# Patient Record
Sex: Female | Born: 1940 | Race: Black or African American | Hispanic: No | Smoking: Never smoker
Health system: Southern US, Community
[De-identification: ages and names within clinical notes are randomized; demographics above are authoritative.]

## PROBLEM LIST (undated history)

## (undated) DIAGNOSIS — M1990 Unspecified osteoarthritis, unspecified site: Secondary | ICD-10-CM

## (undated) DIAGNOSIS — T7840XA Allergy, unspecified, initial encounter: Secondary | ICD-10-CM

## (undated) DIAGNOSIS — F0390 Unspecified dementia without behavioral disturbance: Secondary | ICD-10-CM

## (undated) DIAGNOSIS — H269 Unspecified cataract: Secondary | ICD-10-CM

## (undated) DIAGNOSIS — I10 Essential (primary) hypertension: Secondary | ICD-10-CM

## (undated) DIAGNOSIS — E119 Type 2 diabetes mellitus without complications: Secondary | ICD-10-CM

## (undated) HISTORY — DX: Unspecified osteoarthritis, unspecified site: M19.90

## (undated) HISTORY — PX: THYROID SURGERY: SHX805

## (undated) HISTORY — DX: Unspecified cataract: H26.9

## (undated) HISTORY — DX: Allergy, unspecified, initial encounter: T78.40XA

## (undated) HISTORY — PX: ABDOMINAL HYSTERECTOMY: SHX81

## (undated) HISTORY — PX: EYE SURGERY: SHX253

---

## 2005-05-24 ENCOUNTER — Ambulatory Visit: Payer: Self-pay | Admitting: Internal Medicine

## 2005-10-19 ENCOUNTER — Emergency Department: Payer: Self-pay | Admitting: Emergency Medicine

## 2006-07-26 ENCOUNTER — Ambulatory Visit: Payer: Self-pay | Admitting: Internal Medicine

## 2007-01-21 ENCOUNTER — Emergency Department (HOSPITAL_COMMUNITY): Admission: EM | Admit: 2007-01-21 | Discharge: 2007-01-21 | Payer: Self-pay | Admitting: Emergency Medicine

## 2007-06-11 ENCOUNTER — Ambulatory Visit: Payer: Self-pay | Admitting: Urology

## 2007-08-01 ENCOUNTER — Ambulatory Visit: Payer: Self-pay | Admitting: Internal Medicine

## 2007-09-07 ENCOUNTER — Ambulatory Visit: Payer: Self-pay | Admitting: Physician Assistant

## 2007-10-26 ENCOUNTER — Ambulatory Visit: Payer: Self-pay | Admitting: Unknown Physician Specialty

## 2008-09-04 ENCOUNTER — Ambulatory Visit: Payer: Self-pay | Admitting: Internal Medicine

## 2009-04-15 ENCOUNTER — Ambulatory Visit: Payer: Self-pay | Admitting: Ophthalmology

## 2009-04-28 ENCOUNTER — Ambulatory Visit: Payer: Self-pay | Admitting: Ophthalmology

## 2009-09-09 ENCOUNTER — Ambulatory Visit: Payer: Self-pay | Admitting: Unknown Physician Specialty

## 2009-09-11 ENCOUNTER — Ambulatory Visit: Payer: Self-pay | Admitting: Unknown Physician Specialty

## 2010-03-16 ENCOUNTER — Ambulatory Visit: Payer: Self-pay | Admitting: Unknown Physician Specialty

## 2010-07-14 ENCOUNTER — Ambulatory Visit: Payer: Self-pay | Admitting: Internal Medicine

## 2010-09-12 ENCOUNTER — Emergency Department: Payer: Self-pay | Admitting: Emergency Medicine

## 2010-09-15 ENCOUNTER — Ambulatory Visit: Payer: Self-pay | Admitting: Unknown Physician Specialty

## 2011-09-20 ENCOUNTER — Ambulatory Visit: Payer: Self-pay | Admitting: Internal Medicine

## 2012-09-20 ENCOUNTER — Ambulatory Visit: Payer: Self-pay | Admitting: Internal Medicine

## 2013-06-16 ENCOUNTER — Encounter (HOSPITAL_COMMUNITY): Payer: Self-pay | Admitting: Emergency Medicine

## 2013-06-16 ENCOUNTER — Emergency Department (INDEPENDENT_AMBULATORY_CARE_PROVIDER_SITE_OTHER)
Admission: EM | Admit: 2013-06-16 | Discharge: 2013-06-16 | Disposition: A | Discharge status: Home / Self Care | Payer: Medicare Other | Source: Home / Self Care | Attending: Family Medicine | Admitting: Family Medicine

## 2013-06-16 DIAGNOSIS — A088 Other specified intestinal infections: Secondary | ICD-10-CM

## 2013-06-16 DIAGNOSIS — A084 Viral intestinal infection, unspecified: Secondary | ICD-10-CM

## 2013-06-16 MED ORDER — ONDANSETRON 4 MG PO TBDP
8.0000 mg | ORAL_TABLET | Freq: Once | ORAL | Status: AC
  Administered 2013-06-16: 8 mg via ORAL

## 2013-06-16 MED ORDER — ONDANSETRON HCL 8 MG PO TABS: 8.0000 mg | ORAL_TABLET | Freq: Two times a day (BID) | ORAL | Status: DC | PRN

## 2013-06-16 MED ORDER — ONDANSETRON 4 MG PO TBDP
ORAL_TABLET | ORAL | Status: AC
  Filled 2013-06-16: qty 2

## 2013-06-16 NOTE — ED Notes (Signed)
C/o abd pain States lower abd pain Has been vomiting, diarrhea which started on Friday Denies urinary sx

## 2013-06-16 NOTE — ED Provider Notes (Signed)
Lauren Frederick is a 72 y.o. female who presents to Urgent Care today for resolving nausea vomiting diarrhea abdominal pain. Patient developed vomiting and diarrhea on the evening of the 25th. She attributes this to a honey ham. No other family members were sick. Her symptoms persisted until this morning. She has had one episode of vomiting and 2 episodes of diarrhea today. She is improving. Her diarrhea and vomiting is getting better as is her abdominal pain. Her abdominal pain is diffuse lower and mild. She denies any fevers or chills. She denies any blood in her stool or vomit. She denies any melanotic stool. She feels well otherwise. She took one tablet of Imodium which helped.  Of note patient has not taken her blood pressure medications today because of vomiting   History reviewed. No pertinent past medical history. History  Substance Use Topics  . Smoking status: Not on file  . Smokeless tobacco: Not on file  . Alcohol Use: Not on file   ROS as above Medications reviewed. No current facility-administered medications for this encounter.   Current Outpatient Prescriptions  Medication Sig Dispense Refill  . amitriptyline (ELAVIL) 25 MG tablet Take 25 mg by mouth at bedtime.      Marland Kitchen amLODipine (NORVASC) 10 MG tablet Take 10 mg by mouth daily.      . fexofenadine (ALLEGRA) 180 MG tablet Take 180 mg by mouth daily.      Marland Kitchen glipiZIDE (GLUCOTROL) 5 MG tablet Take by mouth daily before breakfast.      . lisinopril (PRINIVIL,ZESTRIL) 40 MG tablet Take 40 mg by mouth daily.      . pravastatin (PRAVACHOL) 20 MG tablet Take 20 mg by mouth daily.      . ondansetron (ZOFRAN) 8 MG tablet Take 1 tablet (8 mg total) by mouth 2 (two) times daily as needed for nausea or vomiting.  12 tablet  0    Exam:  BP 165/88  Pulse 108  Temp(Src) 98.4 F (36.9 C) (Oral)  Resp 19  SpO2 97% Gen: Well NAD HEENT: EOMI,  MMM Lungs: Normal work of breathing. CTABL Heart: RRR no MRG Abd: NABS, Soft. NT, ND no  masses palpated. No guarding or rebound. Exts: Non edematous BL  LE, warm and well perfused.    Assessment and Plan: 72 y.o. female with viral gastroenteritis. The most likely explanation. Plan to treat with Zofran. Advised cautious use of Imodium. Followup with primary care provider. Encourage oral hydration. Discussed warning signs or symptoms. Please see discharge instructions. Patient expresses understanding.      Rodolph Bong, MD 06/16/13 [phone]

## 2013-06-16 NOTE — ED Notes (Signed)
Bed: UC06 Expected date: 06/16/13 Expected time: 5:30 PM Means of arrival:  Comments: //delouse//

## 2013-10-31 ENCOUNTER — Ambulatory Visit: Payer: Self-pay | Admitting: Unknown Physician Specialty

## 2014-01-22 ENCOUNTER — Ambulatory Visit: Payer: Self-pay | Admitting: Family Medicine

## 2014-02-17 ENCOUNTER — Encounter (HOSPITAL_COMMUNITY): Payer: Self-pay | Admitting: Emergency Medicine

## 2014-02-17 ENCOUNTER — Emergency Department (INDEPENDENT_AMBULATORY_CARE_PROVIDER_SITE_OTHER)
Admission: EM | Admit: 2014-02-17 | Discharge: 2014-02-17 | Disposition: A | Discharge status: Other Acute Inpatient Hospital | Payer: Medicare Other | Source: Home / Self Care | Attending: Emergency Medicine | Admitting: Emergency Medicine

## 2014-02-17 ENCOUNTER — Emergency Department (HOSPITAL_COMMUNITY): Payer: Medicare Other

## 2014-02-17 ENCOUNTER — Emergency Department (HOSPITAL_COMMUNITY)
Admission: EM | Admit: 2014-02-17 | Discharge: 2014-02-17 | Disposition: A | Discharge status: Home / Self Care | Payer: Medicare Other | Attending: Emergency Medicine | Admitting: Emergency Medicine

## 2014-02-17 DIAGNOSIS — R109 Unspecified abdominal pain: Secondary | ICD-10-CM | POA: Insufficient documentation

## 2014-02-17 DIAGNOSIS — K5289 Other specified noninfective gastroenteritis and colitis: Secondary | ICD-10-CM | POA: Insufficient documentation

## 2014-02-17 DIAGNOSIS — I10 Essential (primary) hypertension: Secondary | ICD-10-CM | POA: Insufficient documentation

## 2014-02-17 DIAGNOSIS — K529 Noninfective gastroenteritis and colitis, unspecified: Secondary | ICD-10-CM

## 2014-02-17 DIAGNOSIS — Z79899 Other long term (current) drug therapy: Secondary | ICD-10-CM | POA: Diagnosis not present

## 2014-02-17 DIAGNOSIS — R1084 Generalized abdominal pain: Secondary | ICD-10-CM

## 2014-02-17 DIAGNOSIS — E119 Type 2 diabetes mellitus without complications: Secondary | ICD-10-CM | POA: Insufficient documentation

## 2014-02-17 DIAGNOSIS — K920 Hematemesis: Secondary | ICD-10-CM

## 2014-02-17 HISTORY — DX: Type 2 diabetes mellitus without complications: E11.9

## 2014-02-17 HISTORY — DX: Essential (primary) hypertension: I10

## 2014-02-17 LAB — CBC WITH DIFFERENTIAL/PLATELET
Basophils Absolute: 0 10*3/uL (ref 0.0–0.1)
Basophils Relative: 0 % (ref 0–1)
Eosinophils Absolute: 0.1 10*3/uL (ref 0.0–0.7)
Eosinophils Relative: 3 % (ref 0–5)
HEMATOCRIT: 37 % (ref 36.0–46.0)
HEMOGLOBIN: 12.4 g/dL (ref 12.0–15.0)
LYMPHS ABS: 1 10*3/uL (ref 0.7–4.0)
LYMPHS PCT: 20 % (ref 12–46)
MCH: 30 pg (ref 26.0–34.0)
MCHC: 33.5 g/dL (ref 30.0–36.0)
MCV: 89.4 fL (ref 78.0–100.0)
MONO ABS: 0.7 10*3/uL (ref 0.1–1.0)
MONOS PCT: 14 % — AB (ref 3–12)
NEUTROS ABS: 3.2 10*3/uL (ref 1.7–7.7)
NEUTROS PCT: 63 % (ref 43–77)
Platelets: 200 10*3/uL (ref 150–400)
RBC: 4.14 MIL/uL (ref 3.87–5.11)
RDW: 13.1 % (ref 11.5–15.5)
WBC: 5 10*3/uL (ref 4.0–10.5)

## 2014-02-17 LAB — POCT URINALYSIS DIP (DEVICE)
BILIRUBIN URINE: NEGATIVE
GLUCOSE, UA: NEGATIVE mg/dL
Ketones, ur: NEGATIVE mg/dL
LEUKOCYTES UA: NEGATIVE
NITRITE: NEGATIVE
Protein, ur: NEGATIVE mg/dL
Specific Gravity, Urine: 1.01 (ref 1.005–1.030)
UROBILINOGEN UA: 0.2 mg/dL (ref 0.0–1.0)
pH: 7 (ref 5.0–8.0)

## 2014-02-17 LAB — COMPREHENSIVE METABOLIC PANEL
ALBUMIN: 3.4 g/dL — AB (ref 3.5–5.2)
ALK PHOS: 65 U/L (ref 39–117)
ALT: 13 U/L (ref 0–35)
ANION GAP: 13 (ref 5–15)
AST: 14 U/L (ref 0–37)
BILIRUBIN TOTAL: 0.5 mg/dL (ref 0.3–1.2)
BUN: 3 mg/dL — AB (ref 6–23)
CHLORIDE: 100 meq/L (ref 96–112)
CO2: 24 mEq/L (ref 19–32)
CREATININE: 0.69 mg/dL (ref 0.50–1.10)
Calcium: 9.1 mg/dL (ref 8.4–10.5)
GFR calc Af Amer: >90 mL/min (ref >90)
GFR calc non Af Amer: 84 mL/min — ABNORMAL LOW (ref >90)
Glucose, Bld: 117 mg/dL — ABNORMAL HIGH (ref 70–99)
POTASSIUM: 3.3 meq/L — AB (ref 3.7–5.3)
Sodium: 137 mEq/L (ref 137–147)
TOTAL PROTEIN: 6.7 g/dL (ref 6.0–8.3)

## 2014-02-17 LAB — POC OCCULT BLOOD, ED: Fecal Occult Bld: NEGATIVE

## 2014-02-17 LAB — LIPASE, BLOOD: LIPASE: 7 U/L — AB (ref 11–59)

## 2014-02-17 MED ORDER — SODIUM CHLORIDE 0.9 % IV BOLUS (SEPSIS)
500.0000 mL | Freq: Once | INTRAVENOUS | Status: AC
  Administered 2014-02-17: 500 mL via INTRAVENOUS

## 2014-02-17 MED ORDER — ONDANSETRON 4 MG PO TBDP
8.0000 mg | ORAL_TABLET | Freq: Once | ORAL | Status: AC
  Administered 2014-02-17: 8 mg via ORAL

## 2014-02-17 MED ORDER — IOHEXOL 300 MG/ML  SOLN
25.0000 mL | INTRAMUSCULAR | Status: AC
  Administered 2014-02-17: 25 mL via ORAL

## 2014-02-17 MED ORDER — ONDANSETRON 4 MG PO TBDP
ORAL_TABLET | ORAL | Status: AC
  Filled 2014-02-17: qty 2

## 2014-02-17 MED ORDER — ONDANSETRON 4 MG PO TBDP: 4.0000 mg | ORAL_TABLET | Freq: Three times a day (TID) | ORAL | Status: DC | PRN

## 2014-02-17 MED ORDER — TRAMADOL HCL 50 MG PO TABS: 50.0000 mg | ORAL_TABLET | Freq: Four times a day (QID) | ORAL | Status: DC | PRN

## 2014-02-17 MED ORDER — ONDANSETRON HCL 4 MG/2ML IJ SOLN
4.0000 mg | Freq: Once | INTRAMUSCULAR | Status: AC
  Administered 2014-02-17: 4 mg via INTRAVENOUS
  Filled 2014-02-17: qty 2

## 2014-02-17 MED ORDER — IOHEXOL 300 MG/ML  SOLN: 100.0000 mL | Freq: Once | INTRAMUSCULAR | Status: DC | PRN

## 2014-02-17 MED ORDER — SODIUM CHLORIDE 0.9 % IV SOLN
INTRAVENOUS | Status: DC
  Administered 2014-02-17: 1000 mL via INTRAVENOUS

## 2014-02-17 NOTE — ED Notes (Signed)
Transfer from [facility] for abd pain, N/V, and dark stools, and blood in emesis. This has been going on for 4 days.

## 2014-02-17 NOTE — ED Notes (Signed)
Pt remains monitored by blood pressure, pulse ox, and 5 lead.  

## 2014-02-17 NOTE — ED Notes (Signed)
MD at bedside. 

## 2014-02-17 NOTE — ED Notes (Signed)
Pts vitals updated and IV removed. Pt is getting dressed and awaiting discharge paperwork at bedside.

## 2014-02-17 NOTE — ED Notes (Signed)
Returned from ct 

## 2014-02-17 NOTE — Discharge Instructions (Signed)
Take the Zofran as needed for the nausea and vomiting. Take the tramadol as needed for the abdominal cramping. Return for any new or worse symptoms. Return for vomiting increased amounts of blood or blood in the bowel movements. Or worse pain. Bowel movements been black in color. Make an appointment to followup with your regular Dr.

## 2014-02-17 NOTE — ED Provider Notes (Signed)
Chief Complaint   Chief Complaint  Patient presents with  . Abdominal Pain    History of Present Illness   Lauren Frederick is a 73 year old female who has had a four-day history of generalized abdominal pain, nausea, vomiting, and diarrhea. Previously her vomitus has been clear or contained food that she had eaten. Today she vomited up a large amount of bright red blood. She's had numerous diarrheal stools every day, her stools are watery and dark. She denies any fever or chills. She has felt somewhat weak, dizzy, lightheaded. She denies any respiratory symptoms or chest pain. She denies any sick exposures, suspicious ingestions, or foreign travel.  Review of Systems   Other than as noted above, the patient denies any of the following symptoms: Constitutional:  No fever, chills, weight loss or anorexia. Abdomen:  No nausea, vomiting, hematememesis, melena, diarrhea, or hematochezia. GU:  No dysuria, frequency, urgency, or hematuria. Gyn:  No vaginal discharge, itching, abnormal bleeding, dyspareunia, or pelvic pain.  PMFSH   Past medical history, family history, social history, meds, and allergies were reviewed. Current meds include amitriptyline, amlodipine, glipizide, lisinopril, and pravastatin. She has a sensitivity to aspirin. She has hypertension, diabetes, and a history of ulcers.  Physical Exam     Vital signs:  BP 144/71  Pulse 110  Temp(Src) 98.4 F (36.9 C) (Oral)  Resp 16  SpO2 98% Gen:  Alert, oriented, in no distress. Lungs:  Breath sounds clear and equal bilaterally.  No wheezes, rales or rhonchi. Heart:  Regular rhythm.  No gallops or murmers.   Abdomen:  Abdomen is soft but she has generalized tenderness to palpation without guarding or rebound. No organomegaly or mass. Bowel sounds are hyperactive. Skin:  Clear, warm and dry.  No rash.  Labs   Results for orders placed during the hospital encounter of 02/17/14  POCT URINALYSIS DIP (DEVICE)      Result Value  Ref Range   Glucose, UA NEGATIVE  NEGATIVE mg/dL   Bilirubin Urine NEGATIVE  NEGATIVE   Ketones, ur NEGATIVE  NEGATIVE mg/dL   Specific Gravity, Urine 1.010  1.005 - 1.030   Hgb urine dipstick TRACE (*) NEGATIVE   pH 7.0  5.0 - 8.0   Protein, ur NEGATIVE  NEGATIVE mg/dL   Urobilinogen, UA 0.2  0.0 - 1.0 mg/dL   Nitrite NEGATIVE  NEGATIVE   Leukocytes, UA NEGATIVE  NEGATIVE   Course in Urgent Care Center   The following medications were given:  Medications  ondansetron (ZOFRAN-ODT) disintegrating tablet 8 mg (not administered)   Assessment   The primary encounter diagnosis was Generalized abdominal pain. A diagnosis of Hematemesis with nausea was also pertinent to this visit.  Differential diagnosis includes viral gastroenteritis, ulcer disease, gastritis, gallbladder disease, diverticulitis, or ischemic bowel disease. She'll need complete workup. Also with history of GI bleeding she'll need to be monitored for drop in hemoglobin. Finally she appears to be mildly dehydrated with some tachycardia. She'll probably need some IV fluids.  Plan     The patient was transferred to the ED via shuttle in stable condition.  Medical Decision Making:  73 year old female has a 4 day history of generalized abdominal abdominal pain, nausea, vomiting and hemeatemesis.  She has also had diarrhea with dark stools.  On exam her abdomen is soft and has generalized tenderness with hyperactive bowel sounds.  Differential is extensive, but with hematenesis and tachycardia she will need GI workup.       Dineen Kid  Lorenz Coaster, MD 02/17/14 [phone]

## 2014-02-17 NOTE — ED Notes (Signed)
Patient transported to CT 

## 2014-02-17 NOTE — ED Notes (Signed)
Pt      Reports    abd       Pain     With     Nausea   Vomiting        And  Diarrhea      X  3  Days   Reports      Noticed    Some  Blood       In      Vomitus           She  Is  Awake  And  Alert  And  Oriented

## 2014-02-17 NOTE — ED Notes (Signed)
Pt remains monitored by blood pressure, pulse ox, and 5 lead. pts family remains at bedside.  

## 2014-02-17 NOTE — ED Provider Notes (Signed)
CSN: [id number]     Arrival date & time 02/17/14  4098 History   First MD Initiated Contact with Patient 02/17/14 [phone]     Chief Complaint  Patient presents with  . Abdominal Pain     (Consider location/radiation/quality/duration/timing/severity/associated sxs/prior Treatment) Patient is a 73 y.o. female presenting with abdominal pain. The history is provided by the patient.  Abdominal Pain Associated symptoms: diarrhea, nausea and vomiting   Associated symptoms: no chest pain, no constipation, no dysuria, no fever and no shortness of breath    patient sent in from urgent care for nausea vomiting and diarrhea for the past 4 days on and off lower quadrant abdominal pain. Had some vomiting of blood or streaks of blood. Patient's heart rate was somewhat elevated there a 1/10. Rate here is below 100. Patient has similar problem back in November for a thought was a gastroenteritis. Patient stated that her stools have been brown has not seen blood in the stools have not been black in color. Patient has not felt like she's got pass out. Patient's been vomiting some over the past 4 days. Had today was the first time has been streaks of blood. No pools of blood.  Past Medical History  Diagnosis Date  . Hypertension   . Diabetes mellitus without complication    Past Surgical History  Procedure Laterality Date  . Abdominal hysterectomy    . Thyroid surgery     No family history on file. History  Substance Use Topics  . Smoking status: Never Smoker   . Smokeless tobacco: Not on file  . Alcohol Use: No   OB History   Grav Para Term Preterm Abortions TAB SAB Ect Mult Living                 Review of Systems  Constitutional: Negative for fever.  HENT: Negative for congestion.   Eyes: Negative for visual disturbance.  Respiratory: Negative for shortness of breath.   Cardiovascular: Negative for chest pain.  Gastrointestinal: Positive for nausea, vomiting, abdominal pain and diarrhea.  Negative for constipation and blood in stool.  Genitourinary: Negative for dysuria.  Musculoskeletal: Negative for back pain.  Skin: Negative for rash.  Neurological: Negative for headaches.  Hematological: Does not bruise/bleed easily.  Psychiatric/Behavioral: Negative for confusion.      Allergies  Aspirin  Home Medications   Prior to Admission medications   Medication Sig Start Date End Date Taking? Authorizing Provider  amitriptyline (ELAVIL) 25 MG tablet Take 25 mg by mouth at bedtime.   Yes Historical Provider, MD  amLODipine (NORVASC) 10 MG tablet Take 10 mg by mouth daily.   Yes Historical Provider, MD  fexofenadine (ALLEGRA) 180 MG tablet Take 180 mg by mouth daily.   Yes Historical Provider, MD  glipiZIDE (GLUCOTROL) 5 MG tablet Take by mouth daily before breakfast.   Yes Historical Provider, MD  lisinopril (PRINIVIL,ZESTRIL) 40 MG tablet Take 40 mg by mouth daily.   Yes Historical Provider, MD  pravastatin (PRAVACHOL) 20 MG tablet Take 20 mg by mouth daily.   Yes Historical Provider, MD  ondansetron (ZOFRAN ODT) 4 MG disintegrating tablet Take 1 tablet (4 mg total) by mouth every 8 (eight) hours as needed for nausea or vomiting. 02/17/14   Vanetta Mulders, MD  traMADol (ULTRAM) 50 MG tablet Take 1 tablet (50 mg total) by mouth every 6 (six) hours as needed. 02/17/14   Vanetta Mulders, MD   BP 112/60  Pulse 94  Temp(Src) 98.3 F (36.8 C) (  Oral)  Resp 20  Ht  (1.651 m)  Wt 168 lb (76.204 kg)  BMI 27.96 kg/m2  SpO2 97% Physical Exam  Nursing note and vitals reviewed. Constitutional: She is oriented to person, place, and time. She appears well-developed and well-nourished. No distress.  HENT:  Head: Normocephalic and atraumatic.  Mouth/Throat: Oropharynx is clear and moist.  Eyes: Conjunctivae and EOM are normal. Pupils are equal, round, and reactive to light.  Neck: Normal range of motion.  Cardiovascular: Normal rate, regular rhythm and normal heart sounds.    No murmur heard. Pulmonary/Chest: Effort normal and breath sounds normal. No respiratory distress.  Abdominal: Soft. Bowel sounds are normal. There is no tenderness.  Genitourinary:  Perianal area is normal. No evidence of hemorrhoids. Rectal exam no significant stool in the vault not grossly bloody black in color. Lab run Hemoccult.  Musculoskeletal: Normal range of motion.  Neurological: She is alert and oriented to person, place, and time. No cranial nerve deficit. She exhibits normal muscle tone. Coordination normal.  Skin: No rash noted.    ED Course  Procedures (including critical care time) Labs Review Labs Reviewed  COMPREHENSIVE METABOLIC PANEL - Abnormal; Notable for the following:    Potassium 3.3 (*)    Glucose, Bld 117 (*)    BUN 3 (*)    Albumin 3.4 (*)    GFR calc non Af Amer 84 (*)    All other components within normal limits  LIPASE, BLOOD - Abnormal; Notable for the following:    Lipase 7 (*)    All other components within normal limits  CBC WITH DIFFERENTIAL - Abnormal; Notable for the following:    Monocytes Relative 14 (*)    All other components within normal limits  CLOSTRIDIUM DIFFICILE BY PCR  POC OCCULT BLOOD, ED   Results for orders placed during the hospital encounter of 02/17/14  COMPREHENSIVE METABOLIC PANEL      Result Value Ref Range   Sodium 137  137 - 147 mEq/L   Potassium 3.3 (*) 3.7 - 5.3 mEq/L   Chloride 100  96 - 112 mEq/L   CO2 24  19 - 32 mEq/L   Glucose, Bld 117 (*) 70 - 99 mg/dL   BUN 3 (*) 6 - 23 mg/dL   Creatinine, Ser 1.61  0.50 - 1.10 mg/dL   Calcium 9.1  8.4 - 09.6 mg/dL   Total Protein 6.7  6.0 - 8.3 g/dL   Albumin 3.4 (*) 3.5 - 5.2 g/dL   AST 14  0 - 37 U/L   ALT 13  0 - 35 U/L   Alkaline Phosphatase 65  39 - 117 U/L   Total Bilirubin 0.5  0.3 - 1.2 mg/dL   GFR calc non Af Amer 84 (*) >90 mL/min   GFR calc Af Amer >90  >90 mL/min   Anion gap 13  5 - 15  LIPASE, BLOOD      Result Value Ref Range   Lipase 7 (*) 11 -  59 U/L  CBC WITH DIFFERENTIAL      Result Value Ref Range   WBC 5.0  4.0 - 10.5 K/uL   RBC 4.14  3.87 - 5.11 MIL/uL   Hemoglobin 12.4  12.0 - 15.0 g/dL   HCT 04.5  40.9 - 81.1 %   MCV 89.4  78.0 - 100.0 fL   MCH 30.0  26.0 - 34.0 pg   MCHC 33.5  30.0 - 36.0 g/dL   RDW 91.4  78.2 -  15.5 %   Platelets 200  150 - 400 K/uL   Neutrophils Relative % 63  43 - 77 %   Neutro Abs 3.2  1.7 - 7.7 K/uL   Lymphocytes Relative 20  12 - 46 %   Lymphs Abs 1.0  0.7 - 4.0 K/uL   Monocytes Relative 14 (*) 3 - 12 %   Monocytes Absolute 0.7  0.1 - 1.0 K/uL   Eosinophils Relative 3  0 - 5 %   Eosinophils Absolute 0.1  0.0 - 0.7 K/uL   Basophils Relative 0  0 - 1 %   Basophils Absolute 0.0  0.0 - 0.1 K/uL     Imaging Review Ct Abdomen Pelvis W Contrast  02/17/2014   CLINICAL DATA:  Abdominal pain.  EXAM: CT ABDOMEN AND PELVIS WITH CONTRAST  TECHNIQUE: Multidetector CT imaging of the abdomen and pelvis was performed using the standard protocol following bolus administration of intravenous contrast.  CONTRAST:  100 cc Omnipaque 300  COMPARISON:  None.  FINDINGS: The liver, gallbladder, spleen, pancreas, adrenal glands are within normal limits.  Small cysts in the kidneys.  Ascending and transverse colon are relatively distended without clear or abrupt transition point likely due to colonic ileus. No disproportionate dilatation of small bowel  Small mesenteric nodes.  Bladder is unremarkable. Uterus is absent. Adnexa are within normal limits.  Lumbar degenerative disc disease. Broad-based disc herniation at L3-4 with some degree of spinal stenosis and lateral recess narrowing left greater than right.  No destructive bone lesion.  No free-fluid.  IMPRESSION: Colonic distention without clear transition point likely due to colonic ileus.   Electronically Signed   By: Maryclare Bean M.D.   On: 02/17/2014 12:19     EKG Interpretation None      MDM   Final diagnoses:  Gastroenteritis    Patient sent over from  urgent care this morning for abdominal pain nausea vomiting supposedly dark stools and having some blood in the emesis. Patient describes streaking no pool of blood. Symptoms been ongoing for about 4 days. Patient with nausea vomiting and also having some diarrhea. Crampy abdominal pain in the lower quadrants. Today's workup shows normal hemoglobin hematocrit. Patient without any vomiting or diarrhea here. No bowel movements here. CT scan raises concern for low bit of colonic distention without evidence of of obstruction. Patient improved here significantly with Zofran.  Patient's rectal exam is negative for dark stool or a gross blood. Patient in the neck was stable was tachycardic at urgent care at tachycardic here. Patient discharged home with close followup with her regular Dr. Precautions provided. Patient has had a colonoscopy this year without any significant findings. Suspect symptoms could be related to a gastroenteritis perhaps viral. The patient sits this occur couple times he year. Also suspect that the blood that occurred was more just from the frequent vomiting. Patient and vomiting for several days if worse only streaks of blood just today.    Vanetta Mulders, MD 02/17/14 [phone]

## 2014-02-17 NOTE — Discharge Instructions (Signed)
We have determined that your problem requires further evaluation in the emergency department.  We will take care of your transport there.  Once at the emergency department, you will be evaluated by a provider and they will order whatever treatment or tests they deem necessary.  We cannot guarantee that they will do any specific test or do any specific treatment.  ° °

## 2015-04-24 ENCOUNTER — Other Ambulatory Visit: Payer: Self-pay | Admitting: Family Medicine

## 2015-04-24 DIAGNOSIS — Z1231 Encounter for screening mammogram for malignant neoplasm of breast: Secondary | ICD-10-CM

## 2015-04-28 ENCOUNTER — Ambulatory Visit (INDEPENDENT_AMBULATORY_CARE_PROVIDER_SITE_OTHER): Payer: Medicare Other | Admitting: Internal Medicine

## 2015-04-28 VITALS — BP 128/76 | HR 87 | Temp 99.3°F | Resp 16 | Ht 64.0 in | Wt 160.4 lb

## 2015-04-28 DIAGNOSIS — R509 Fever, unspecified: Secondary | ICD-10-CM

## 2015-04-28 DIAGNOSIS — E1122 Type 2 diabetes mellitus with diabetic chronic kidney disease: Secondary | ICD-10-CM

## 2015-04-28 DIAGNOSIS — K529 Noninfective gastroenteritis and colitis, unspecified: Secondary | ICD-10-CM

## 2015-04-28 DIAGNOSIS — E860 Dehydration: Secondary | ICD-10-CM

## 2015-04-28 LAB — POCT URINALYSIS DIP (MANUAL ENTRY)
BILIRUBIN UA: NEGATIVE
GLUCOSE UA: NEGATIVE
Ketones, POC UA: NEGATIVE
LEUKOCYTES UA: NEGATIVE
NITRITE UA: NEGATIVE
Protein Ur, POC: NEGATIVE
Spec Grav, UA: 1.01
UROBILINOGEN UA: 0.2
pH, UA: 7

## 2015-04-28 LAB — POCT CBC
Granulocyte percent: 66.6 %G (ref 37–80)
HEMATOCRIT: 39.6 % (ref 37.7–47.9)
Hemoglobin: 13.5 g/dL (ref 12.2–16.2)
LYMPH, POC: 1.4 (ref 0.6–3.4)
MCH, POC: 29.3 pg (ref 27–31.2)
MCHC: 34.1 g/dL (ref 31.8–35.4)
MCV: 86 fL (ref 80–97)
MID (CBC): 0.1 (ref 0–0.9)
MPV: 7.1 fL (ref 0–99.8)
POC GRANULOCYTE: 3 (ref 2–6.9)
POC LYMPH %: 30.2 % (ref 10–50)
POC MID %: 3.2 % (ref 0–12)
Platelet Count, POC: 232 10*3/uL (ref 142–424)
RBC: 4.6 M/uL (ref 4.04–5.48)
RDW, POC: 14.2 %
WBC: 4.5 10*3/uL — AB (ref 4.6–10.2)

## 2015-04-28 LAB — POC MICROSCOPIC URINALYSIS (UMFC): Mucus: ABSENT

## 2015-04-28 LAB — COMPREHENSIVE METABOLIC PANEL
ALT: 15 U/L (ref 6–29)
AST: 17 U/L (ref 10–35)
Albumin: 3.8 g/dL (ref 3.6–5.1)
Alkaline Phosphatase: 60 U/L (ref 33–130)
BUN: 5 mg/dL — AB (ref 7–25)
CHLORIDE: 102 mmol/L (ref 98–110)
CO2: 25 mmol/L (ref 20–31)
CREATININE: 0.56 mg/dL — AB (ref 0.60–0.93)
Calcium: 9.2 mg/dL (ref 8.6–10.4)
Glucose, Bld: 150 mg/dL — ABNORMAL HIGH (ref 65–99)
POTASSIUM: 3.4 mmol/L — AB (ref 3.5–5.3)
SODIUM: 139 mmol/L (ref 135–146)
TOTAL PROTEIN: 6.6 g/dL (ref 6.1–8.1)
Total Bilirubin: 0.4 mg/dL (ref 0.2–1.2)

## 2015-04-28 LAB — GLUCOSE, POCT (MANUAL RESULT ENTRY): POC Glucose: 153 mg/dl — AB (ref 70–99)

## 2015-04-28 LAB — LIPASE: LIPASE: 5 U/L — AB (ref 7–60)

## 2015-04-28 MED ORDER — ONDANSETRON HCL 8 MG PO TABS: 8.0000 mg | ORAL_TABLET | Freq: Three times a day (TID) | ORAL | Status: DC | PRN

## 2015-04-28 MED ORDER — ONDANSETRON 4 MG PO TBDP
8.0000 mg | ORAL_TABLET | Freq: Once | ORAL | Status: AC
  Administered 2015-04-28: 8 mg via ORAL

## 2015-04-28 NOTE — Progress Notes (Signed)
Patient ID: Lauren Frederick, female   DOB: 07-Nov-1940, 74 y.o.   MRN: [id number]   04/28/2015 at 12:44 PM  Lauren Frederick / DOB: 10-13-1940 / MRN: [id number]  Problem list reviewed and updated by me where necessary.   SUBJECTIVE  Lauren Frederick is a 74 y.o. ill appearing female presenting for the chief complaint of nausea, vomiting, diarrhea for 3-4 days, now is feeling weaker and wobbly on her feet. Her primary care is not in the [facility] system and is in [place].  She is on meds for diates and htn but has not taken them for 4-5 days.   She  has a past medical history of Hypertension; Diabetes mellitus without complication (HCC); Allergy; Arthritis; and Cataract.    Medications reviewed and updated by myself where necessary, and exist elsewhere in the encounter.   Lauren Frederick is allergic to aspirin. She  reports that she has never smoked. She does not have any smokeless tobacco history on file. She reports that she does not drink alcohol or use illicit drugs. She  has no sexual activity history on file. The patient  has past surgical history that includes Abdominal hysterectomy; Thyroid surgery; Thyroid surgery (approx. 30 years ago); and Eye surgery.  Her family history includes Cancer in her mother; Hypertension in her father.  Review of Systems  Constitutional: Positive for fever, malaise/fatigue and diaphoresis.  HENT: Negative.   Respiratory: Negative for shortness of breath.   Cardiovascular: Negative for chest pain.  Gastrointestinal: Positive for nausea, vomiting, abdominal pain and diarrhea. Negative for blood in stool and melena.  Genitourinary: Negative.   Skin: Negative for rash.  Neurological: Positive for dizziness and weakness. Negative for headaches.    OBJECTIVE  Her  height is  (1.626 m) and weight is 160 lb 6.4 oz (72.757 kg). Her oral temperature is 99.3 F (37.4 C). Her blood pressure is 128/76 and her pulse is 87. Her respiration is 16 and oxygen saturation is  98%.  The patient's body mass index is 27.52 kg/(m^2).  Physical Exam  Constitutional: She is oriented to person, place, and time. She appears well-developed and well-nourished. She appears distressed.  HENT:  Head: Normocephalic.  Eyes: Conjunctivae and EOM are normal. Pupils are equal, round, and reactive to light. No scleral icterus.  Neck: Normal range of motion. Neck supple.  Cardiovascular: Normal rate, regular rhythm and normal heart sounds.   Respiratory: Effort normal and breath sounds normal.  GI: Soft. She exhibits no distension and no mass. There is tenderness. There is no rebound and no guarding.  Musculoskeletal: Normal range of motion.  Neurological: She is alert and oriented to person, place, and time. She has normal strength. No cranial nerve deficit or sensory deficit. Gait abnormal. Coordination normal.  Unsteady gate due to light headed and weak  Skin: Skin is warm. She is diaphoretic.  Psychiatric: She has a normal mood and affect.   IV fluids one liter given feels much better Zofran odt  given--feels better  Results for orders placed or performed in visit on 04/28/15 (from the past 24 hour(s))  POCT CBC     Status: Abnormal   Collection Time: 04/28/15 11:55 AM  Result Value Ref Range   WBC 4.5 (A) 4.6 - 10.2 K/uL   Lymph, poc 1.4 0.6 - 3.4   POC LYMPH PERCENT 30.2 10 - 50 %L   MID (cbc) 0.1 0 - 0.9   POC MID % 3.2 0 - 12 %M  POC Granulocyte 3.0 2 - 6.9   Granulocyte percent 66.6 37 - 80 %G   RBC 4.60 4.04 - 5.48 M/uL   Hemoglobin 13.5 12.2 - 16.2 g/dL   HCT, POC 16.1 09.6 - 47.9 %   MCV 86.0 80 - 97 fL   MCH, POC 29.3 27 - 31.2 pg   MCHC 34.1 31.8 - 35.4 g/dL   RDW, POC 04.5 %   Platelet Count, POC 232 142 - 424 K/uL   MPV 7.1 0 - 99.8 fL  POCT glucose (manual entry)     Status: Abnormal   Collection Time: 04/28/15 11:56 AM  Result Value Ref Range   POC Glucose 153 (A) 70 - 99 mg/dl  POCT urinalysis dipstick     Status: Abnormal   Collection  Time: 04/28/15 12:00 PM  Result Value Ref Range   Color, UA yellow yellow   Clarity, UA clear clear   Glucose, UA negative negative   Bilirubin, UA negative negative   Ketones, POC UA negative negative   Spec Grav, UA 1.010    Blood, UA trace-lysed (A) negative   pH, UA 7.0    Protein Ur, POC negative negative   Urobilinogen, UA 0.2    Nitrite, UA Negative Negative   Leukocytes, UA Negative Negative  POCT Microscopic Urinalysis ([facility])     Status: None   Collection Time: 04/28/15 12:00 PM  Result Value Ref Range   WBC,UR,HPF,POC None None WBC/hpf   RBC,UR,HPF,POC None None RBC/hpf   Bacteria None None, Too numerous to count   Mucus Absent Absent   Epithelial Cells, UR Per Microscopy None None, Too numerous to count cells/hpf    ASSESSMENT & PLAN  Lauren Frederick was seen today for fatigue.  Diagnoses and all orders for this visit:  Acute gastroenteritis -     POCT CBC -     IFOBT POC (occult bld, rslt in office) -     POCT glucose (manual entry) -     POCT urinalysis dipstick -     POCT Microscopic Urinalysis ([facility]) -     Comprehensive metabolic panel -     Stool culture -     Lipase -     ondansetron (ZOFRAN-ODT) disintegrating tablet 8 mg; Take 2 tablets (8 mg total) by mouth once. -     ondansetron (ZOFRAN) 8 MG tablet; Take 1 tablet (8 mg total) by mouth every 8 (eight) hours as needed for nausea or vomiting.  Fever, unspecified -     POCT CBC -     IFOBT POC (occult bld, rslt in office) -     POCT glucose (manual entry) -     POCT urinalysis dipstick -     POCT Microscopic Urinalysis ([facility]) -     Comprehensive metabolic panel -     Stool culture -     Lipase -     ondansetron (ZOFRAN) 8 MG tablet; Take 1 tablet (8 mg total) by mouth every 8 (eight) hours as needed for nausea or vomiting.  Dehydration -     POCT CBC -     IFOBT POC (occult bld, rslt in office) -     POCT glucose (manual entry) -     POCT urinalysis dipstick -     POCT Microscopic Urinalysis  ([facility]) -     Comprehensive metabolic panel -     Stool culture -     Lipase -     ondansetron (ZOFRAN) 8 MG tablet; Take 1  tablet (8 mg total) by mouth every 8 (eight) hours as needed for nausea or vomiting.  Type 2 diabetes mellitus with chronic kidney disease, without long-term current use of insulin, unspecified CKD stage (HCC) -     POCT CBC -     IFOBT POC (occult bld, rslt in office) -     POCT glucose (manual entry) -     POCT urinalysis dipstick -     POCT Microscopic Urinalysis ([facility]) -     Comprehensive metabolic panel -     Stool culture -     Lipase -     ondansetron (ZOFRAN) 8 MG tablet; Take 1 tablet (8 mg total) by mouth every 8 (eight) hours as needed for nausea or vomiting.

## 2015-04-28 NOTE — Patient Instructions (Addendum)
Viral Gastroenteritis Viral gastroenteritis is also known as stomach flu. This condition affects the stomach and intestinal tract. It can cause sudden diarrhea and vomiting. The illness typically lasts 3 to 8 days. Most people develop an immune response that eventually gets rid of the virus. While this natural response develops, the virus can make you quite ill. CAUSES  Many different viruses can cause gastroenteritis, such as rotavirus or noroviruses. You can catch one of these viruses by consuming contaminated food or water. You may also catch a virus by sharing utensils or other personal items with an infected person or by touching a contaminated surface. SYMPTOMS  The most common symptoms are diarrhea and vomiting. These problems can cause a severe loss of body fluids (dehydration) and a body salt (electrolyte) imbalance. Other symptoms may include:  Fever.  Headache.  Fatigue.  Abdominal pain. DIAGNOSIS  Your caregiver can usually diagnose viral gastroenteritis based on your symptoms and a physical exam. A stool sample may also be taken to test for the presence of viruses or other infections. TREATMENT  This illness typically goes away on its own. Treatments are aimed at rehydration. The most serious cases of viral gastroenteritis involve vomiting so severely that you are not able to keep fluids down. In these cases, fluids must be given through an intravenous line (IV). HOME CARE INSTRUCTIONS   Drink enough fluids to keep your urine clear or pale yellow. Drink small amounts of fluids frequently and increase the amounts as tolerated.  Ask your caregiver for specific rehydration instructions.  Avoid:  Foods high in sugar.  Alcohol.  Carbonated drinks.  Tobacco.  Juice.  Caffeine drinks.  Extremely hot or cold fluids.  Fatty, greasy foods.  Too much intake of anything at one time.  Dairy products until 24 to 48 hours after diarrhea stops.  You may consume probiotics.  Probiotics are active cultures of beneficial bacteria. They may lessen the amount and number of diarrheal stools in adults. Probiotics can be found in yogurt with active cultures and in supplements.  Wash your hands well to avoid spreading the virus.  Only take over-the-counter or prescription medicines for pain, discomfort, or fever as directed by your caregiver. Do not give aspirin to children. Antidiarrheal medicines are not recommended.  Ask your caregiver if you should continue to take your regular prescribed and over-the-counter medicines.  Keep all follow-up appointments as directed by your caregiver. SEEK IMMEDIATE MEDICAL CARE IF:   You are unable to keep fluids down.  You do not urinate at least once every 6 to 8 hours.  You develop shortness of breath.  You notice blood in your stool or vomit. This may look like coffee grounds.  You have abdominal pain that increases or is concentrated in one small area (localized).  You have persistent vomiting or diarrhea.  You have a fever.  The patient is a child younger than 3 months, and he or she has a fever.  The patient is a child older than 3 months, and he or she has a fever and persistent symptoms.  The patient is a child older than 3 months, and he or she has a fever and symptoms suddenly get worse.  The patient is a baby, and he or she has no tears when crying. MAKE SURE YOU:   Understand these instructions.  Will watch your condition.  Will get help right away if you are not doing well or get worse.   This information is not intended to replace   advice given to you by your health care provider. Make sure you discuss any questions you have with your health care provider.   Document Released: 06/06/2005 Document Revised: 08/29/2011 Document Reviewed: 03/23/2011 Elsevier Interactive Patient Education 2016 Elsevier Inc. Dehydration, Adult Dehydration is a condition in which you do not have enough fluid or water in  your body. It happens when you take in less fluid than you lose. Vital organs such as the kidneys, brain, and heart cannot function without a proper amount of fluids. Any loss of fluids from the body can cause dehydration.  Dehydration can range from mild to severe. This condition should be treated right away to help prevent it from becoming severe. CAUSES  This condition may be caused by:  Vomiting.  Diarrhea.  Excessive sweating, such as when exercising in hot or humid weather.  Not drinking enough fluid during strenuous exercise or during an illness.  Excessive urine output.  Fever.  Certain medicines. RISK FACTORS This condition is more likely to develop in:  People who are taking certain medicines that cause the body to lose excess fluid (diuretics).   People who have a chronic illness, such as diabetes, that may increase urination.  Older adults.   People who live at high altitudes.   People who participate in endurance sports.  SYMPTOMS  Mild Dehydration  Thirst.  Dry lips.  Slightly dry mouth.  Dry, warm skin. Moderate Dehydration  Very dry mouth.   Muscle cramps.   Dark urine and decreased urine production.   Decreased tear production.   Headache.   Light-headedness, especially when you stand up from a sitting position.  Severe Dehydration  Changes in skin.   Cold and clammy skin.   Skin does not spring back quickly when lightly pinched and released.   Changes in body fluids.   Extreme thirst.   No tears.   Not able to sweat when body temperature is high, such as in hot weather.   Minimal urine production.   Changes in vital signs.   Rapid, weak pulse (more than 100 beats per minute when you are sitting still).   Rapid breathing.   Low blood pressure.   Other changes.   Sunken eyes.   Cold hands and feet.   Confusion.  Lethargy and difficulty being awakened.  Fainting (syncope).   Short-term  weight loss.   Unconsciousness. DIAGNOSIS  This condition may be diagnosed based on your symptoms. You may also have tests to determine how severe your dehydration is. These tests may include:   Urine tests.   Blood tests.  TREATMENT  Treatment for this condition depends on the severity. Mild or moderate dehydration can often be treated at home. Treatment should be started right away. Do not wait until dehydration becomes severe. Severe dehydration needs to be treated at the hospital. Treatment for Mild Dehydration  Drinking plenty of water to replace the fluid you have lost.   Replacing minerals in your blood (electrolytes) that you may have lost.  Treatment for Moderate Dehydration  Consuming oral rehydration solution (ORS). Treatment for Severe Dehydration  Receiving fluid through an IV tube.   Receiving electrolyte solution through a feeding tube that is passed through your nose and into your stomach (nasogastric tube or NG tube).  Correcting any abnormalities in electrolytes. HOME CARE INSTRUCTIONS   Drink enough fluid to keep your urine clear or pale yellow.   Drink water or fluid slowly by taking small sips. You can also try sucking on   ice cubes.  Have food or beverages that contain electrolytes. Examples include bananas and sports drinks.  Take over-the-counter and prescription medicines only as told by your health care provider.   Prepare ORS according to the manufacturer's instructions. Take sips of ORS every 5 minutes until your urine returns to normal.  If you have vomiting or diarrhea, continue to try to drink water, ORS, or both.   If you have diarrhea, avoid:   Beverages that contain caffeine.   Fruit juice.   Milk.   Carbonated soft drinks.  Do not take salt tablets. This can lead to the condition of having too much sodium in your body (hypernatremia).  SEEK MEDICAL CARE IF:  You cannot eat or drink without vomiting.  You have  had moderate diarrhea during a period of more than 24 hours.  You have a fever. SEEK IMMEDIATE MEDICAL CARE IF:   You have extreme thirst.  You have severe diarrhea.  You have not urinated in 6-8 hours, or you have urinated only a small amount of very dark urine.  You have shriveled skin.  You are dizzy, confused, or both.   This information is not intended to replace advice given to you by your health care provider. Make sure you discuss any questions you have with your health care provider.   Document Released: 06/06/2005 Document Revised: 02/25/2015 Document Reviewed: 10/22/2014 Elsevier Interactive Patient Education 2016 Elsevier Inc. Food Choices to Help Relieve Diarrhea, Adult When you have diarrhea, the foods you eat and your eating habits are very important. Choosing the right foods and drinks can help relieve diarrhea. Also, because diarrhea can last up to 7 days, you need to replace lost fluids and electrolytes (such as sodium, potassium, and chloride) in order to help prevent dehydration.  WHAT GENERAL GUIDELINES DO I NEED TO FOLLOW?  Slowly drink 1 cup (8 oz) of fluid for each episode of diarrhea. If you are getting enough fluid, your urine will be clear or pale yellow.  Eat starchy foods. Some good choices include white rice, white toast, pasta, low-fiber cereal, baked potatoes (without the skin), saltine crackers, and bagels.  Avoid large servings of any cooked vegetables.  Limit fruit to two servings per day. A serving is  cup or 1 small piece.  Choose foods with less than 2 g of fiber per serving.  Limit fats to less than 8 tsp (38 g) per day.  Avoid fried foods.  Eat foods that have probiotics in them. Probiotics can be found in certain dairy products.  Avoid foods and beverages that may increase the speed at which food moves through the stomach and intestines (gastrointestinal tract). Things to avoid include:  High-fiber foods, such as dried fruit, raw  fruits and vegetables, nuts, seeds, and whole grain foods.  Spicy foods and high-fat foods.  Foods and beverages sweetened with high-fructose corn syrup, honey, or sugar alcohols such as xylitol, sorbitol, and mannitol. WHAT FOODS ARE RECOMMENDED? Grains White rice. White, French, or pita breads (fresh or toasted), including plain rolls, buns, or bagels. White pasta. Saltine, soda, or graham crackers. Pretzels. Low-fiber cereal. Cooked cereals made with water (such as cornmeal, farina, or cream cereals). Plain muffins. Matzo. Melba toast. Zwieback.  Vegetables Potatoes (without the skin). Strained tomato and vegetable juices. Most well-cooked and canned vegetables without seeds. Tender lettuce. Fruits Cooked or canned applesauce, apricots, cherries, fruit cocktail, grapefruit, peaches, pears, or plums. Fresh bananas, apples without skin, cherries, grapes, cantaloupe, grapefruit, peaches, oranges, or plums.    Meat and Other Protein Products Baked or boiled chicken. Eggs. Tofu. Fish. Seafood. Smooth peanut butter. Ground or well-cooked tender beef, ham, veal, lamb, pork, or poultry.  Dairy Plain yogurt, kefir, and unsweetened liquid yogurt. Lactose-free milk, buttermilk, or soy milk. Plain hard cheese. Beverages Sport drinks. Clear broths. Diluted fruit juices (except prune). Regular, caffeine-free sodas such as ginger ale. Water. Decaffeinated teas. Oral rehydration solutions. Sugar-free beverages not sweetened with sugar alcohols. Other Bouillon, broth, or soups made from recommended foods.  The items listed above may not be a complete list of recommended foods or beverages. Contact your dietitian for more options. WHAT FOODS ARE NOT RECOMMENDED? Grains Whole grain, whole wheat, bran, or rye breads, rolls, pastas, crackers, and cereals. Wild or brown rice. Cereals that contain more than 2 g of fiber per serving. Corn tortillas or taco shells. Cooked or dry oatmeal. Granola.  Popcorn. Vegetables Raw vegetables. Cabbage, broccoli, Brussels sprouts, artichokes, baked beans, beet greens, corn, kale, legumes, peas, sweet potatoes, and yams. Potato skins. Cooked spinach and cabbage. Fruits Dried fruit, including raisins and dates. Raw fruits. Stewed or dried prunes. Fresh apples with skin, apricots, mangoes, pears, raspberries, and strawberries.  Meat and Other Protein Products Chunky peanut butter. Nuts and seeds. Beans and lentils. Bacon.  Dairy High-fat cheeses. Milk, chocolate milk, and beverages made with milk, such as milk shakes. Cream. Ice cream. Sweets and Desserts Sweet rolls, doughnuts, and sweet breads. Pancakes and waffles. Fats and Oils Butter. Cream sauces. Margarine. Salad oils. Plain salad dressings. Olives. Avocados.  Beverages Caffeinated beverages (such as coffee, tea, soda, or energy drinks). Alcoholic beverages. Fruit juices with pulp. Prune juice. Soft drinks sweetened with high-fructose corn syrup or sugar alcohols. Other Coconut. Hot sauce. Chili powder. Mayonnaise. Gravy. Cream-based or milk-based soups.  The items listed above may not be a complete list of foods and beverages to avoid. Contact your dietitian for more information. WHAT SHOULD I DO IF I BECOME DEHYDRATED? Diarrhea can sometimes lead to dehydration. Signs of dehydration include dark urine and dry mouth and skin. If you think you are dehydrated, you should rehydrate with an oral rehydration solution. These solutions can be purchased at pharmacies, retail stores, or online.  Drink -1 cup (120-240 mL) of oral rehydration solution each time you have an episode of diarrhea. If drinking this amount makes your diarrhea worse, try drinking smaller amounts more often. For example, drink 1-3 tsp (5-15 mL) every 5-10 minutes.  A general rule for staying hydrated is to drink 1-2 L of fluid per day. Talk to your health care provider about the specific amount you should be drinking each day.  Drink enough fluids to keep your urine clear or pale yellow.   This information is not intended to replace advice given to you by your health care provider. Make sure you discuss any questions you have with your health care provider.   Document Released: 08/27/2003 Document Revised: 06/27/2014 Document Reviewed: 04/29/2013 Elsevier Interactive Patient Education 2016 Elsevier Inc.  

## 2015-04-29 ENCOUNTER — Ambulatory Visit: Payer: Self-pay

## 2015-08-07 ENCOUNTER — Other Ambulatory Visit: Payer: Self-pay | Admitting: Family Medicine

## 2015-08-07 DIAGNOSIS — R1084 Generalized abdominal pain: Secondary | ICD-10-CM

## 2015-08-12 ENCOUNTER — Ambulatory Visit
Admission: RE | Admit: 2015-08-12 | Discharge: 2015-08-12 | Disposition: A | Discharge status: Home / Self Care | Payer: Medicare Other | Source: Ambulatory Visit | Attending: Family Medicine | Admitting: Family Medicine

## 2015-08-12 DIAGNOSIS — R1084 Generalized abdominal pain: Secondary | ICD-10-CM | POA: Diagnosis present

## 2015-08-12 MED ORDER — IOHEXOL 300 MG/ML  SOLN
100.0000 mL | Freq: Once | INTRAMUSCULAR | Status: AC | PRN
  Administered 2015-08-12: 100 mL via INTRAVENOUS

## 2016-02-08 ENCOUNTER — Encounter: Payer: Self-pay | Admitting: Emergency Medicine

## 2016-02-08 ENCOUNTER — Emergency Department: Payer: Medicare Other

## 2016-02-08 ENCOUNTER — Inpatient Hospital Stay
Admission: EM | Admit: 2016-02-08 | Discharge: 2016-02-09 | DRG: 641 | Disposition: A | Discharge status: Home / Self Care | Payer: Medicare Other | Attending: Internal Medicine | Admitting: Internal Medicine

## 2016-02-08 DIAGNOSIS — R109 Unspecified abdominal pain: Secondary | ICD-10-CM

## 2016-02-08 DIAGNOSIS — Z79899 Other long term (current) drug therapy: Secondary | ICD-10-CM | POA: Diagnosis not present

## 2016-02-08 DIAGNOSIS — I10 Essential (primary) hypertension: Secondary | ICD-10-CM | POA: Diagnosis present

## 2016-02-08 DIAGNOSIS — Z8249 Family history of ischemic heart disease and other diseases of the circulatory system: Secondary | ICD-10-CM | POA: Diagnosis not present

## 2016-02-08 DIAGNOSIS — Z7984 Long term (current) use of oral hypoglycemic drugs: Secondary | ICD-10-CM | POA: Diagnosis not present

## 2016-02-08 DIAGNOSIS — E876 Hypokalemia: Secondary | ICD-10-CM | POA: Diagnosis present

## 2016-02-08 DIAGNOSIS — R197 Diarrhea, unspecified: Secondary | ICD-10-CM

## 2016-02-08 DIAGNOSIS — E860 Dehydration: Secondary | ICD-10-CM | POA: Diagnosis present

## 2016-02-08 DIAGNOSIS — Z886 Allergy status to analgesic agent status: Secondary | ICD-10-CM | POA: Diagnosis not present

## 2016-02-08 DIAGNOSIS — R1110 Vomiting, unspecified: Secondary | ICD-10-CM

## 2016-02-08 DIAGNOSIS — A084 Viral intestinal infection, unspecified: Secondary | ICD-10-CM | POA: Diagnosis present

## 2016-02-08 DIAGNOSIS — E119 Type 2 diabetes mellitus without complications: Secondary | ICD-10-CM | POA: Diagnosis present

## 2016-02-08 LAB — CBC
HCT: 37.7 % (ref 35.0–47.0)
HEMOGLOBIN: 12.9 g/dL (ref 12.0–16.0)
MCH: 30 pg (ref 26.0–34.0)
MCHC: 34.3 g/dL (ref 32.0–36.0)
MCV: 87.6 fL (ref 80.0–100.0)
Platelets: 221 10*3/uL (ref 150–440)
RBC: 4.3 MIL/uL (ref 3.80–5.20)
RDW: 13.9 % (ref 11.5–14.5)
WBC: 6.7 10*3/uL (ref 3.6–11.0)

## 2016-02-08 LAB — TROPONIN I

## 2016-02-08 LAB — URINALYSIS COMPLETE WITH MICROSCOPIC (ARMC ONLY)
BILIRUBIN URINE: NEGATIVE
Bacteria, UA: NONE SEEN
GLUCOSE, UA: NEGATIVE mg/dL
Ketones, ur: NEGATIVE mg/dL
Leukocytes, UA: NEGATIVE
NITRITE: NEGATIVE
Protein, ur: NEGATIVE mg/dL
SPECIFIC GRAVITY, URINE: 1.002 — AB (ref 1.005–1.030)
pH: 6 (ref 5.0–8.0)

## 2016-02-08 LAB — COMPREHENSIVE METABOLIC PANEL
ALBUMIN: 3.6 g/dL (ref 3.5–5.0)
ALK PHOS: 58 U/L (ref 38–126)
ALT: 17 U/L (ref 14–54)
AST: 18 U/L (ref 15–41)
Anion gap: 8 (ref 5–15)
BILIRUBIN TOTAL: 0.3 mg/dL (ref 0.3–1.2)
BUN: 7 mg/dL (ref 6–20)
CALCIUM: 8.7 mg/dL — AB (ref 8.9–10.3)
CO2: 22 mmol/L (ref 22–32)
CREATININE: 0.74 mg/dL (ref 0.44–1.00)
Chloride: 105 mmol/L (ref 101–111)
GFR calc Af Amer: >60 mL/min (ref >60)
GFR calc non Af Amer: >60 mL/min (ref >60)
GLUCOSE: 115 mg/dL — AB (ref 65–99)
Potassium: 2.6 mmol/L — CL (ref 3.5–5.1)
SODIUM: 135 mmol/L (ref 135–145)
TOTAL PROTEIN: 6.7 g/dL (ref 6.5–8.1)

## 2016-02-08 LAB — LIPASE, BLOOD: Lipase: 11 U/L (ref 11–51)

## 2016-02-08 LAB — MAGNESIUM: MAGNESIUM: 1.6 mg/dL — AB (ref 1.7–2.4)

## 2016-02-08 MED ORDER — HEPARIN SODIUM (PORCINE) 5000 UNIT/ML IJ SOLN
5000.0000 [IU] | Freq: Three times a day (TID) | INTRAMUSCULAR | Status: DC
  Administered 2016-02-08 – 2016-02-09 (×3): 5000 [IU] via SUBCUTANEOUS
  Filled 2016-02-08 (×3): qty 1

## 2016-02-08 MED ORDER — AMLODIPINE BESYLATE 10 MG PO TABS
10.0000 mg | ORAL_TABLET | Freq: Every day | ORAL | Status: DC
  Filled 2016-02-08: qty 1

## 2016-02-08 MED ORDER — POTASSIUM CHLORIDE IN NACL 40-0.9 MEQ/L-% IV SOLN
INTRAVENOUS | Status: DC
  Administered 2016-02-08: 75 mL/h via INTRAVENOUS
  Filled 2016-02-08: qty 1000

## 2016-02-08 MED ORDER — ONDANSETRON HCL 4 MG PO TABS: 8.0000 mg | ORAL_TABLET | Freq: Three times a day (TID) | ORAL | Status: DC | PRN

## 2016-02-08 MED ORDER — POTASSIUM CHLORIDE CRYS ER 20 MEQ PO TBCR
60.0000 meq | EXTENDED_RELEASE_TABLET | Freq: Once | ORAL | Status: AC
  Administered 2016-02-08: 60 meq via ORAL
  Filled 2016-02-08: qty 3

## 2016-02-08 MED ORDER — IOPAMIDOL (ISOVUE-300) INJECTION 61%
100.0000 mL | Freq: Once | INTRAVENOUS | Status: AC | PRN
  Administered 2016-02-08: 100 mL via INTRAVENOUS

## 2016-02-08 MED ORDER — MAGNESIUM SULFATE 2 GM/50ML IV SOLN
2.0000 g | Freq: Once | INTRAVENOUS | Status: AC
  Administered 2016-02-08: 2 g via INTRAVENOUS
  Filled 2016-02-08: qty 50

## 2016-02-08 MED ORDER — SODIUM CHLORIDE 0.9 % IV BOLUS (SEPSIS)
500.0000 mL | Freq: Once | INTRAVENOUS | Status: AC
  Administered 2016-02-08: 500 mL via INTRAVENOUS

## 2016-02-08 MED ORDER — SODIUM CHLORIDE 0.9% FLUSH
3.0000 mL | Freq: Two times a day (BID) | INTRAVENOUS | Status: DC
  Administered 2016-02-08 – 2016-02-09 (×2): 3 mL via INTRAVENOUS

## 2016-02-08 MED ORDER — AMITRIPTYLINE HCL 25 MG PO TABS
25.0000 mg | ORAL_TABLET | Freq: Every day | ORAL | Status: DC
  Administered 2016-02-08: 25 mg via ORAL
  Filled 2016-02-08: qty 1

## 2016-02-08 MED ORDER — POTASSIUM CHLORIDE CRYS ER 20 MEQ PO TBCR
20.0000 meq | EXTENDED_RELEASE_TABLET | Freq: Two times a day (BID) | ORAL | Status: AC
  Administered 2016-02-08 – 2016-02-09 (×2): 20 meq via ORAL
  Filled 2016-02-08 (×2): qty 1

## 2016-02-08 MED ORDER — DIATRIZOATE MEGLUMINE & SODIUM 66-10 % PO SOLN
15.0000 mL | Freq: Once | ORAL | Status: AC
  Administered 2016-02-08: 15 mL via ORAL

## 2016-02-08 MED ORDER — POTASSIUM CHLORIDE 10 MEQ/100ML IV SOLN
10.0000 meq | Freq: Once | INTRAVENOUS | Status: AC
  Administered 2016-02-08: 10 meq via INTRAVENOUS
  Filled 2016-02-08: qty 100

## 2016-02-08 NOTE — ED Notes (Signed)
Pt transported to floor by Mayra, tech.

## 2016-02-08 NOTE — H&P (Signed)
[employer] [facility]   PATIENT NAME: Lauren Frederick    MR#:  [id number]  DATE OF BIRTH:  08-14-1940  DATE OF ADMISSION:  02/08/2016  PRIMARY CARE PHYSICIAN: BABAOFF, Lavada Mesi, MD   REQUESTING/REFERRING PHYSICIAN: Inocencio Homes  CHIEF COMPLAINT:   Chief Complaint  Patient presents with  . hypokalemia    HISTORY OF PRESENT ILLNESS: Lauren Frederick  is a 75 y.o. female with a known history of Arthritis, cataract, diabetes, hypertension- for last 3 days started having vomiting and diarrhea without any associated fevers chills or abdominal pain. She started taking Zofran orally and it helped to control her nausea and vomiting but continued to have diarrhea in spite of taking loperamide. Eyes any blood in vomit or stool, but her diarrhea was completely watery in 3-4 times a day. She started feeling very weak and dizzy when getting up so decided to come to emergency room tonight and found to have severe hypokalemia and hypomagnesemia, CT scan of abdomen did not show any acute finding so she is given for admission for electrolyte imbalance.  PAST MEDICAL HISTORY:   Past Medical History:  Diagnosis Date  . Allergy   . Arthritis   . Cataract   . Diabetes mellitus without complication (HCC)   . Hypertension     PAST SURGICAL HISTORY: Past Surgical History:  Procedure Laterality Date  . ABDOMINAL HYSTERECTOMY    . EYE SURGERY    . THYROID SURGERY    . THYROID SURGERY  approx. 30 years ago    SOCIAL HISTORY:  Social History  Substance Use Topics  . Smoking status: Never Smoker  . Smokeless tobacco: Never Used  . Alcohol use No    FAMILY HISTORY:  Family History  Problem Relation Age of Onset  . Cancer Mother   . Hypertension Father     DRUG ALLERGIES:  Allergies  Allergen Reactions  . Aspirin Hives and Swelling    REVIEW OF SYSTEMS:   CONSTITUTIONAL: No fever, Positive for fatigue or weakness.  EYES: No blurred or double vision.  EARS, NOSE, AND  THROAT: No tinnitus or ear pain.  RESPIRATORY: No cough, shortness of breath, wheezing or hemoptysis.  CARDIOVASCULAR: No chest pain, orthopnea, edema.  GASTROINTESTINAL: Positive nausea, vomiting, diarrhea , no abdominal pain.  GENITOURINARY: No dysuria, hematuria.  ENDOCRINE: No polyuria, nocturia,  HEMATOLOGY: No anemia, easy bruising or bleeding SKIN: No rash or lesion. MUSCULOSKELETAL: No joint pain or arthritis.   NEUROLOGIC: No tingling, numbness, weakness.  PSYCHIATRY: No anxiety or depression.   MEDICATIONS AT HOME:  Prior to Admission medications   Medication Sig Start Date End Date Taking? Authorizing Provider  amitriptyline (ELAVIL) 25 MG tablet Take 25 mg by mouth at bedtime.    Historical Provider, MD  amLODipine (NORVASC) 10 MG tablet Take 10 mg by mouth daily.    Historical Provider, MD  fexofenadine (ALLEGRA) 180 MG tablet Take 180 mg by mouth daily.    Historical Provider, MD  glipiZIDE (GLUCOTROL) 5 MG tablet Take by mouth daily before breakfast.    Historical Provider, MD  lisinopril (PRINIVIL,ZESTRIL) 40 MG tablet Take 40 mg by mouth daily.    Historical Provider, MD  ondansetron (ZOFRAN) 8 MG tablet Take 1 tablet (8 mg total) by mouth every 8 (eight) hours as needed for nausea or vomiting. 04/28/15   Jonita Albee, MD  UNABLE TO FIND Take 4 mg by mouth daily at 2 PM. Med Name: [facility]    Historical Provider,  MD      PHYSICAL EXAMINATION:   VITAL SIGNS: Blood pressure 138/73, pulse 96, temperature 97.6 F (36.4 C), temperature source Oral, resp. rate 17, height 5\' 5"  (1.651 m), weight 74.8 kg (165 lb), SpO2 100 %.  GENERAL:  75 y.o.-year-old patient lying in the bed with no acute distress.  EYES: Pupils equal, round, reactive to light and accommodation. No scleral icterus. Extraocular muscles intact.  HEENT: Head atraumatic, normocephalic. Oropharynx and nasopharynx clear.  NECK:  Supple, no jugular venous distention. No thyroid enlargement, no tenderness.   LUNGS: Normal breath sounds bilaterally, no wheezing, rales,rhonchi or crepitation. No use of accessory muscles of respiration.  CARDIOVASCULAR: S1, S2 normal. No murmurs, rubs, or gallops.  ABDOMEN: Soft, nontender, nondistended. Bowel sounds present. No organomegaly or mass.  EXTREMITIES: No pedal edema, cyanosis, or clubbing.  NEUROLOGIC: Cranial nerves II through XII are intact. Muscle strength 5/5 in all extremities. Sensation intact. Gait not checked.  PSYCHIATRIC: The patient is alert and oriented x 3.  SKIN: No obvious rash, lesion, or ulcer.   LABORATORY PANEL:   CBC  Recent Labs Lab 02/08/16 1527  WBC 6.7  HGB 12.9  HCT 37.7  PLT 221  MCV 87.6  MCH 30.0  MCHC 34.3  RDW 13.9   ------------------------------------------------------------------------------------------------------------------  Chemistries   Recent Labs Lab 02/08/16 1527  NA 135  K 2.6*  CL 105  CO2 22  GLUCOSE 115*  BUN 7  CREATININE 0.74  CALCIUM 8.7*  MG 1.6*  AST 18  ALT 17  ALKPHOS 58  BILITOT 0.3   ------------------------------------------------------------------------------------------------------------------ estimated creatinine clearance is 61.5 mL/min (by C-G formula based on SCr of 0.8 mg/dL). ------------------------------------------------------------------------------------------------------------------ No results for input(s): TSH, T4TOTAL, T3FREE, THYROIDAB in the last 72 hours.  Invalid input(s): FREET3   Coagulation profile No results for input(s): INR, PROTIME in the last 168 hours. ------------------------------------------------------------------------------------------------------------------- No results for input(s): DDIMER in the last 72 hours. -------------------------------------------------------------------------------------------------------------------  Cardiac Enzymes  Recent Labs Lab 02/08/16 1527  TROPONINI <0.03    ------------------------------------------------------------------------------------------------------------------ Invalid input(s): POCBNP  ---------------------------------------------------------------------------------------------------------------  Urinalysis    Component Value Date/Time   COLORURINE STRAW (A) 02/08/2016 1529   APPEARANCEUR CLEAR (A) 02/08/2016 1529   LABSPEC 1.002 (L) 02/08/2016 1529   PHURINE 6.0 02/08/2016 1529   GLUCOSEU NEGATIVE 02/08/2016 1529   HGBUR 1+ (A) 02/08/2016 1529   BILIRUBINUR NEGATIVE 02/08/2016 1529   BILIRUBINUR negative 04/28/2015 1200   KETONESUR NEGATIVE 02/08/2016 1529   PROTEINUR NEGATIVE 02/08/2016 1529   UROBILINOGEN 0.2 04/28/2015 1200   UROBILINOGEN 0.2 02/17/2014 0829   NITRITE NEGATIVE 02/08/2016 1529   LEUKOCYTESUR NEGATIVE 02/08/2016 1529     RADIOLOGY: Ct Abdomen Pelvis W Contrast  Result Date: 02/08/2016 CLINICAL DATA:  Nausea/vomiting/diarrhea x4 days, left lower quadrant abdominal pain EXAM: CT ABDOMEN AND PELVIS WITH CONTRAST TECHNIQUE: Multidetector CT imaging of the abdomen and pelvis was performed using the standard protocol following bolus administration of intravenous contrast. CONTRAST:  100mL ISOVUE-300 IOPAMIDOL (ISOVUE-300) INJECTION 61% COMPARISON:  08/12/2015 FINDINGS: Lower chest: 3 mm subpleural nodule in the right lower lobe (series 4/image 9), unchanged since 2015, benign. Hepatobiliary: Liver is within normal limits. No suspicious/enhancing hepatic lesions. Gallbladder is unremarkable. No intrahepatic or extrahepatic ductal dilatation. Pancreas: Within normal limits. Spleen: Within normal limits. Adrenals/Urinary Tract: Adrenal glands are within normal limits. Small bilateral renal cysts, measuring up to 10 mm in the left lower pole (series 2/ image 44). No hydronephrosis. Bladder is within normal limits. Stomach/Bowel: Stomach is within normal limits. No evidence of  bowel obstruction. Appendix is not  discretely visualized. No bowel wall thickening or inflammatory changes. Vascular/Lymphatic: No evidence of abdominal aortic aneurysm. No suspicious abdominopelvic lymphadenopathy. Reproductive: Status post hysterectomy. No adnexal masses. Other: No abdominopelvic ascites. Musculoskeletal: Degenerative changes of the visualized thoracolumbar spine. IMPRESSION: No evidence of bowel obstruction. No CT findings to account for the patient's abdominal pain. Electronically Signed   By: Charline BillsSriyesh  Krishnan M.D.   On: 02/08/2016 18:15    EKG: Orders placed or performed during the hospital encounter of 02/08/16  . ED EKG  . ED EKG    IMPRESSION AND PLAN:  * Viral gastroenteritis   Likely the symptoms are viral a CT scan of the abdomen is negative.   Continue IV fluid and supportive care.   Check the stool for GI panel.  * Hypokalemia   Replace orally and IV, recheck tomorrow.  * Hypomagnesemia   Replaced IV by ER, recheck tomorrow.  * Hypertension   Continue amlodipine, hold lisinopril.  All the records are reviewed and case discussed with ED provider. Management plans discussed with the patient, family and they are in agreement.  CODE STATUS:Full code  Code Status History    This patient does not have a recorded code status. Please follow your organizational policy for patients in this situation.      Patient's daughter, granddaughter, sister and friend were present in the room during my visit.  TOTAL TIME TAKING CARE OF THIS PATIENT: 50  minutes.    Altamese DillingVACHHANI, Vyctoria Dickman M.D on 02/08/2016   Between 7am to 6pm - Pager - [phone]  After 6pm go to [email or web address] - password [email or web address]  [facility] [place] [facility]  Office  [phone]  CC: Primary care physician; BABAOFF, Lavada MesiMARC E, MD   Note: This dictation was prepared with Dragon dictation along with smaller phrase technology. Any transcriptional errors that result from this process are unintentional.

## 2016-02-08 NOTE — ED Notes (Signed)
Pt given PO fluids to drink.

## 2016-02-08 NOTE — ED Triage Notes (Signed)
C/O N/V/D for the past days.  [facility] and [facility], Potassium 2.8.  Sent to ED for evaluation.

## 2016-02-08 NOTE — ED Provider Notes (Signed)
[facility] Emergency Department Provider Note   ____________________________________________   First MD Initiated Contact with Patient 02/08/16 1615     (approximate)  I have reviewed the triage vital signs and the nursing notes.   HISTORY  Chief Complaint hypokalemia    HPI Pasty ArchLorena G Dovel is a 75 y.o. female history of diabetes, hypertension who presents for evaluation of 4 days of lower abdominal pain, recurrent nonbloody nonbilious emesis and nonbloody diarrhea, gradual onset, constant, severe, improves with Imodium. She denies any fevers, chest pain and difficulty breathing. No dysuria or hematuria. [facility] and sent for evaluation of hypokalemia.   Past Medical History:  Diagnosis Date  . Allergy   . Arthritis   . Cataract   . Diabetes mellitus without complication (HCC)   . Hypertension     There are no active problems to display for this patient.   Past Surgical History:  Procedure Laterality Date  . ABDOMINAL HYSTERECTOMY    . EYE SURGERY    . THYROID SURGERY    . THYROID SURGERY  approx. 30 years ago    Prior to Admission medications   Medication Sig Start Date End Date Taking? Authorizing Provider  amitriptyline (ELAVIL) 25 MG tablet Take 25 mg by mouth at bedtime.    Historical Provider, MD  amLODipine (NORVASC) 10 MG tablet Take 10 mg by mouth daily.    Historical Provider, MD  fexofenadine (ALLEGRA) 180 MG tablet Take 180 mg by mouth daily.    Historical Provider, MD  glipiZIDE (GLUCOTROL) 5 MG tablet Take by mouth daily before breakfast.    Historical Provider, MD  lisinopril (PRINIVIL,ZESTRIL) 40 MG tablet Take 40 mg by mouth daily.    Historical Provider, MD  ondansetron (ZOFRAN) 8 MG tablet Take 1 tablet (8 mg total) by mouth every 8 (eight) hours as needed for nausea or vomiting. 04/28/15   Jonita Albeehris W Guest, MD  UNABLE TO FIND Take 4 mg by mouth daily at 2 PM. Med Name: [facility]    Historical Provider, MD     Allergies Aspirin  Family History  Problem Relation Age of Onset  . Cancer Mother   . Hypertension Father     Social History Social History  Substance Use Topics  . Smoking status: Never Smoker  . Smokeless tobacco: Never Used  . Alcohol use No    Review of Systems Constitutional: No fever/chills Eyes: No visual changes. ENT: No sore throat. Cardiovascular: Denies chest pain. Respiratory: Denies shortness of breath. Gastrointestinal: + abdominal pain.  + nausea, + vomiting.  + diarrhea.  No constipation. Genitourinary: Negative for dysuria. Musculoskeletal: Negative for back pain. Skin: Negative for rash. Neurological: Negative for headaches, focal weakness or numbness.  10-point ROS otherwise negative.  ____________________________________________   PHYSICAL EXAM:  Vitals:   02/08/16 1519 02/08/16 1520 02/08/16 1630 02/08/16 1700  BP: 138/68  132/72 134/75  Pulse: 88  86 88  Resp: 18  18 19   Temp: 97.6 F (36.4 C)     TempSrc: Oral     SpO2: 99%  100% 100%  Weight:  165 lb (74.8 kg)    Height:  5\' 5"  (1.651 m)      VITAL SIGNS: ED Triage Vitals  Enc Vitals Group     BP 02/08/16 1519 138/68     Pulse Rate 02/08/16 1519 88     Resp 02/08/16 1519 18     Temp 02/08/16 1519 97.6 F (36.4 C)  Temp Source 02/08/16 1519 Oral     SpO2 02/08/16 1519 99 %     Weight 02/08/16 1520 165 lb (74.8 kg)     Height 02/08/16 1520 5\' 5"  (1.651 m)     Head Circumference --      Peak Flow --      Pain Score 02/08/16 1524 2     Pain Loc --      Pain Edu? --      Excl. in GC? --     Constitutional: Alert and oriented. Well appearing and in no acute distress. Eyes: Conjunctivae are normal. PERRL. EOMI. Head: Atraumatic. Nose: No congestion/rhinnorhea. Mouth/Throat: Mucous membranes are moist.  Oropharynx non-erythematous. Neck: No stridor.   Cardiovascular: Normal rate, regular rhythm. Grossly normal heart sounds.  Good peripheral circulation. Respiratory:  Normal respiratory effort.  No retractions. Lungs CTAB. Gastrointestinal: Soft with mild tenderness in the suprapubic region in the left lower quadrant, no rebound or guarding. No rigidity.Marland Kitchen No CVA tenderness. Genitourinary: Deferred Musculoskeletal: No lower extremity tenderness nor edema.  No joint effusions. Neurologic:  Normal speech and language. No gross focal neurologic deficits are appreciated. No gait instability. Skin:  Skin is warm, dry and intact. No rash noted. Psychiatric: Mood and affect are normal. Speech and behavior are normal.  ____________________________________________   LABS (all labs ordered are listed, but only abnormal results are displayed)  Labs Reviewed  COMPREHENSIVE METABOLIC PANEL - Abnormal; Notable for the following:       Result Value   Potassium 2.6 (*)    Glucose, Bld 115 (*)    Calcium 8.7 (*)    All other components within normal limits  URINALYSIS COMPLETEWITH MICROSCOPIC ([facility] ONLY) - Abnormal; Notable for the following:    Color, Urine STRAW (*)    APPearance CLEAR (*)    Specific Gravity, Urine 1.002 (*)    Hgb urine dipstick 1+ (*)    Squamous Epithelial / LPF 0-5 (*)    All other components within normal limits  MAGNESIUM - Abnormal; Notable for the following:    Magnesium 1.6 (*)    All other components within normal limits  LIPASE, BLOOD  CBC  TROPONIN I   ____________________________________________  EKG  ED ECG REPORT I, Gayla Doss, the attending physician, personally viewed and interpreted this ECG.   Date: 02/08/2016  EKG Time: 15:40  Rate: 88  Rhythm: sinus rhythm with occasional PVCs  Axis: normal  Intervals:none  ST&T Change: No acute ST elevation. Mild ST depression and T-wave inversions in lead 3, V2, V3.  ____________________________________________  RADIOLOGY  CT abdomen and pelvis IMPRESSION:  No evidence of bowel obstruction.    No CT findings to account for the patient's abdominal pain.       ____________________________________________   PROCEDURES  Procedure(s) performed: None  Procedures  Critical Care performed: No  ____________________________________________   INITIAL IMPRESSION / ASSESSMENT AND PLAN / ED COURSE  Pertinent labs & imaging results that were available during my care of the patient were reviewed by me and considered in my medical decision making (see chart for details).  ELLINOR TEST is a 75 y.o. female history of diabetes, hypertension who presents for evaluation of 4 days of lower abdominal pain, recurrent nonbloody nonbilious emesis and nonbloody diarrhea, gradual onset, constant, severe, improves with Imodium. On exam, she is generally well-appearing and in no acute distress. Vital signs stable, she is afebrile. She does have some tender to palpation in the mL left lower quadrant regions.  I reviewed her labs. CBC unremarkable, normal lipase. CMP shows hypokalemia, potassium 2.6, magnesium is also low at 1.6, we will replete both. Urinalysis is not consistent with infection. CT of the abdomen and pelvis shows no acute intra-abdominal pelvic pathology. Case discussed with hospitalist for admission at this time.  Clinical Course     ____________________________________________   FINAL CLINICAL IMPRESSION(S) / ED DIAGNOSES  Final diagnoses:  Abdominal pain, vomiting, and diarrhea  Hypokalemia  Hypomagnesemia      NEW MEDICATIONS STARTED DURING THIS VISIT:  New Prescriptions   No medications on file     Note:  This document was prepared using Dragon voice recognition software and may include unintentional dictation errors.    Gayla DossEryka A Lavere Stork, MD 02/08/16 [phone]

## 2016-02-09 LAB — CBC
HCT: 36.5 % (ref 35.0–47.0)
HEMOGLOBIN: 12.4 g/dL (ref 12.0–16.0)
MCH: 30.4 pg (ref 26.0–34.0)
MCHC: 34 g/dL (ref 32.0–36.0)
MCV: 89.5 fL (ref 80.0–100.0)
Platelets: 209 10*3/uL (ref 150–440)
RBC: 4.09 MIL/uL (ref 3.80–5.20)
RDW: 13.8 % (ref 11.5–14.5)
WBC: 5.6 10*3/uL (ref 3.6–11.0)

## 2016-02-09 LAB — BASIC METABOLIC PANEL
ANION GAP: 7 (ref 5–15)
BUN: 6 mg/dL (ref 6–20)
CALCIUM: 8.3 mg/dL — AB (ref 8.9–10.3)
CO2: 22 mmol/L (ref 22–32)
Chloride: 110 mmol/L (ref 101–111)
Creatinine, Ser: 0.65 mg/dL (ref 0.44–1.00)
GFR calc Af Amer: >60 mL/min (ref >60)
GFR calc non Af Amer: >60 mL/min (ref >60)
GLUCOSE: 99 mg/dL (ref 65–99)
Potassium: 3.7 mmol/L (ref 3.5–5.1)
Sodium: 139 mmol/L (ref 135–145)

## 2016-02-09 LAB — GLUCOSE, CAPILLARY: GLUCOSE-CAPILLARY: 121 mg/dL — AB (ref 65–99)

## 2016-02-09 LAB — MAGNESIUM: Magnesium: 2.1 mg/dL (ref 1.7–2.4)

## 2016-02-09 MED ORDER — ENOXAPARIN SODIUM 40 MG/0.4ML ~~LOC~~ SOLN: 40.0000 mg | SUBCUTANEOUS | Status: DC

## 2016-02-09 MED ORDER — POTASSIUM CHLORIDE CRYS ER 20 MEQ PO TBCR
20.0000 meq | EXTENDED_RELEASE_TABLET | Freq: Once | ORAL | Status: AC
  Administered 2016-02-09: 20 meq via ORAL
  Filled 2016-02-09: qty 1

## 2016-02-09 MED ORDER — INSULIN ASPART 100 UNIT/ML ~~LOC~~ SOLN
0.0000 [IU] | Freq: Three times a day (TID) | SUBCUTANEOUS | Status: DC
Start: 2016-02-09 — End: 2016-02-09
  Administered 2016-02-09: 1 [IU] via SUBCUTANEOUS
  Filled 2016-02-09: qty 1

## 2016-02-09 MED ORDER — POTASSIUM CHLORIDE ER 10 MEQ PO TBCR: 10.0000 meq | EXTENDED_RELEASE_TABLET | Freq: Every day | ORAL | 0 refills | Status: AC

## 2016-02-09 NOTE — Care Management Important Message (Signed)
Important Message  Patient Details  Name: Lauren Frederick MRN: [id number] Date of Birth: 12/29/1940   Medicare Important Message Given:  N/A - LOS <3 / Initial given by admissions    Adonis HugueninBerkhead, Miranda Garber L, RN 02/09/2016, 1:52 PM

## 2016-02-09 NOTE — Discharge Instructions (Signed)
Resume diet and activity as before ° °Follow up with your doctor in 1 week °

## 2016-02-09 NOTE — Progress Notes (Signed)
Patient discharging home. Instructions given to patient and family, verbalized understanding. Son providing transportation home. Iv removed.

## 2016-02-10 NOTE — Discharge Summary (Signed)
[facility] [place] [facility]   PATIENT NAME: Lauren Frederick    MR#:  [id number]  DATE OF BIRTH:  [birth year]  DATE OF ADMISSION:  02/08/2016 ADMITTING PHYSICIAN: Altamese DillingVaibhavkumar Vachhani, MD  DATE OF DISCHARGE: 02/09/2016  1:52 PM  PRIMARY CARE PHYSICIAN: BABAOFF, MARC E, MD   ADMISSION DIAGNOSIS:  Hypokalemia [E87.6] Hypomagnesemia [E83.42] Abdominal pain, vomiting, and diarrhea [R10.9, R11.10, R19.7]  DISCHARGE DIAGNOSIS:  Principal Problem:   Hypokalemia   SECONDARY DIAGNOSIS:   Past Medical History:  Diagnosis Date  . Allergy   . Arthritis   . Cataract   . Diabetes mellitus without complication (HCC)   . Hypertension      ADMITTING HISTORY  HISTORY OF PRESENT ILLNESS: Lauren Frederick  is a 75 y.o. female with a known history of Arthritis, cataract, diabetes, hypertension- for last 3 days started having vomiting and diarrhea without any associated fevers chills or abdominal pain. She started taking Zofran orally and it helped to control her nausea and vomiting but continued to have diarrhea in spite of taking loperamide. Eyes any blood in vomit or stool, but her diarrhea was completely watery in 3-4 times a day. She started feeling very weak and dizzy when getting up so decided to come to emergency room tonight and found to have severe hypokalemia and hypomagnesemia, CT scan of abdomen did not show any acute finding so she is given for admission for electrolyte imbalance.  HOSPITAL COURSE:   * Gastroenteritis with dehydration and hypomagnesemia and hypokalemia Patient had severe diarrhea and vomiting causing dehydration. Presented to the emergency room and blood work showed hypokalemia and hypomagnesemia. Admitted to the hospitalist service. Started on IV fluids and replace potassium and magnesium. Patient had tests order for stool PCR C. difficile. Was in isolation. She did not have any further diarrhea during her 24-hour hospital stay in isolation  was discontinued. On repeat blood work patient's potassium and magnesium are improved. And she is being discharged home in a stable condition. Afebrile. Normal vitals. Potassium supplement prescription sent to pharmacy.  CONSULTS OBTAINED:    DRUG ALLERGIES:   Allergies  Allergen Reactions  . Aspirin Hives and Swelling    DISCHARGE MEDICATIONS:   Discharge Medication List as of 02/09/2016 11:03 AM    START taking these medications   Details  potassium chloride (K-DUR) 10 MEQ tablet Take 1 tablet (10 mEq total) by mouth daily., Starting Tue 02/09/2016, Normal      CONTINUE these medications which have NOT CHANGED   Details  amitriptyline (ELAVIL) 25 MG tablet Take 25 mg by mouth at bedtime., Until Discontinued, Historical Med    atorvastatin (LIPITOR) 20 MG tablet Take 20 mg by mouth daily., Historical Med    Calcium Carbonate-Vitamin D (CALCIUM-VITAMIN D) 500-200 MG-UNIT tablet Take 1 tablet by mouth daily., Historical Med    glipiZIDE (GLUCOTROL) 5 MG tablet Take by mouth daily before breakfast., Until Discontinued, Historical Med    Omega-3 Fatty Acids (FISH OIL PO) Take by mouth., Historical Med    RaNITidine HCl (ZANTAC PO) Take by mouth., Historical Med    ondansetron (ZOFRAN) 8 MG tablet Take 1 tablet (8 mg total) by mouth every 8 (eight) hours as needed for nausea or vomiting., Starting Tue 04/28/2015, Normal        Today   VITAL SIGNS:  Blood pressure 126/65, pulse 83, temperature 98.5 F (36.9 C), temperature source Oral, resp. rate 18, height 5\' 5"  (1.651 m), weight 74.8 kg (165 lb), SpO2 98 %.  I/O:  No intake or output data in the 24 hours ending 02/10/16 1433  PHYSICAL EXAMINATION:  Physical Exam  GENERAL:  75 y.o.-year-old patient lying in the bed with no acute distress.  LUNGS: Normal breath sounds bilaterally, no wheezing, rales,rhonchi or crepitation. No use of accessory muscles of respiration.  CARDIOVASCULAR: S1, S2 normal. No murmurs, rubs, or  gallops.  ABDOMEN: Soft, non-tender, non-distended. Bowel sounds present. No organomegaly or mass.  NEUROLOGIC: Moves all 4 extremities. PSYCHIATRIC: The patient is alert and oriented x 3.  SKIN: No obvious rash, lesion, or ulcer.   DATA REVIEW:   CBC  Recent Labs Lab 02/09/16 0522  WBC 5.6  HGB 12.4  HCT 36.5  PLT 209    Chemistries   Recent Labs Lab 02/08/16 1527 02/09/16 0522  NA 135 139  K 2.6* 3.7  CL 105 110  CO2 22 22  GLUCOSE 115* 99  BUN 7 6  CREATININE 0.74 0.65  CALCIUM 8.7* 8.3*  MG 1.6* 2.1  AST 18  --   ALT 17  --   ALKPHOS 58  --   BILITOT 0.3  --     Cardiac Enzymes  Recent Labs Lab 02/08/16 1527  TROPONINI <0.03    Microbiology Results  No results found for this or any previous visit.  RADIOLOGY:  Ct Abdomen Pelvis W Contrast  Result Date: 02/08/2016 CLINICAL DATA:  Nausea/vomiting/diarrhea x4 days, left lower quadrant abdominal pain EXAM: CT ABDOMEN AND PELVIS WITH CONTRAST TECHNIQUE: Multidetector CT imaging of the abdomen and pelvis was performed using the standard protocol following bolus administration of intravenous contrast. CONTRAST:  100mL ISOVUE-300 IOPAMIDOL (ISOVUE-300) INJECTION 61% COMPARISON:  08/12/2015 FINDINGS: Lower chest: 3 mm subpleural nodule in the right lower lobe (series 4/image 9), unchanged since 2015, benign. Hepatobiliary: Liver is within normal limits. No suspicious/enhancing hepatic lesions. Gallbladder is unremarkable. No intrahepatic or extrahepatic ductal dilatation. Pancreas: Within normal limits. Spleen: Within normal limits. Adrenals/Urinary Tract: Adrenal glands are within normal limits. Small bilateral renal cysts, measuring up to 10 mm in the left lower pole (series 2/ image 44). No hydronephrosis. Bladder is within normal limits. Stomach/Bowel: Stomach is within normal limits. No evidence of bowel obstruction. Appendix is not discretely visualized. No bowel wall thickening or inflammatory changes.  Vascular/Lymphatic: No evidence of abdominal aortic aneurysm. No suspicious abdominopelvic lymphadenopathy. Reproductive: Status post hysterectomy. No adnexal masses. Other: No abdominopelvic ascites. Musculoskeletal: Degenerative changes of the visualized thoracolumbar spine. IMPRESSION: No evidence of bowel obstruction. No CT findings to account for the patient's abdominal pain. Electronically Signed   By: Charline BillsSriyesh  Krishnan M.D.   On: 02/08/2016 18:15    Follow up with PCP in 1 week.  Management plans discussed with the patient, family and they are in agreement.  CODE STATUS:  Code Status History    Date Active Date Inactive Code Status Order ID Comments User Context   02/08/2016 10:10 PM 02/09/2016  6:19 PM Full Code [id number]  Altamese DillingVaibhavkumar Vachhani, MD Inpatient      TOTAL TIME TAKING CARE OF THIS PATIENT ON DAY OF DISCHARGE: more than 30 minutes.   Milagros LollSudini, Hanan Mcwilliams R M.D on 02/10/2016 at 2:33 PM  Between 7am to 6pm - Pager - [phone]  After 6pm go to [email or web address] - password [email or web address] [email or web address]  [facility]  Office  [phone]  CC: Primary care physician; BABAOFF, Lavada MesiMARC E, MD  Note: This dictation was prepared with Dragon dictation along with smaller phrase technology. Any transcriptional errors that result from this process  are unintentional.

## 2016-10-24 ENCOUNTER — Other Ambulatory Visit: Payer: Self-pay | Admitting: Family Medicine

## 2016-10-24 DIAGNOSIS — Z1231 Encounter for screening mammogram for malignant neoplasm of breast: Secondary | ICD-10-CM

## 2016-11-04 ENCOUNTER — Ambulatory Visit
Admission: RE | Admit: 2016-11-04 | Discharge: 2016-11-04 | Disposition: A | Discharge status: Home / Self Care | Payer: Medicare Other | Source: Ambulatory Visit | Attending: Family Medicine | Admitting: Family Medicine

## 2016-11-04 DIAGNOSIS — Z1231 Encounter for screening mammogram for malignant neoplasm of breast: Secondary | ICD-10-CM | POA: Insufficient documentation

## 2018-01-01 ENCOUNTER — Other Ambulatory Visit: Payer: Self-pay | Admitting: Family Medicine

## 2018-01-01 DIAGNOSIS — Z1231 Encounter for screening mammogram for malignant neoplasm of breast: Secondary | ICD-10-CM

## 2018-01-05 ENCOUNTER — Ambulatory Visit
Admission: RE | Admit: 2018-01-05 | Discharge: 2018-01-05 | Disposition: A | Discharge status: Home / Self Care | Payer: Medicare Other | Source: Ambulatory Visit | Attending: Family Medicine | Admitting: Family Medicine

## 2018-01-05 DIAGNOSIS — Z1231 Encounter for screening mammogram for malignant neoplasm of breast: Secondary | ICD-10-CM | POA: Insufficient documentation

## 2019-11-14 ENCOUNTER — Other Ambulatory Visit: Payer: Self-pay | Admitting: Family Medicine

## 2019-11-14 DIAGNOSIS — Z1231 Encounter for screening mammogram for malignant neoplasm of breast: Secondary | ICD-10-CM

## 2019-12-03 ENCOUNTER — Ambulatory Visit
Admission: RE | Admit: 2019-12-03 | Discharge: 2019-12-03 | Disposition: A | Discharge status: Home / Self Care | Payer: Medicare PPO | Source: Ambulatory Visit | Attending: Family Medicine | Admitting: Family Medicine

## 2019-12-03 DIAGNOSIS — Z1231 Encounter for screening mammogram for malignant neoplasm of breast: Secondary | ICD-10-CM | POA: Diagnosis not present

## 2021-03-17 ENCOUNTER — Other Ambulatory Visit: Payer: Self-pay | Admitting: Family Medicine

## 2021-03-17 DIAGNOSIS — Z1231 Encounter for screening mammogram for malignant neoplasm of breast: Secondary | ICD-10-CM

## 2021-04-13 ENCOUNTER — Other Ambulatory Visit: Payer: Self-pay

## 2021-04-13 ENCOUNTER — Ambulatory Visit
Admission: RE | Admit: 2021-04-13 | Discharge: 2021-04-13 | Disposition: A | Discharge status: Home / Self Care | Payer: Medicare PPO | Source: Ambulatory Visit | Attending: Family Medicine | Admitting: Family Medicine

## 2021-04-13 DIAGNOSIS — Z1231 Encounter for screening mammogram for malignant neoplasm of breast: Secondary | ICD-10-CM | POA: Insufficient documentation

## 2022-08-27 ENCOUNTER — Encounter (HOSPITAL_COMMUNITY): Payer: Self-pay

## 2022-08-27 ENCOUNTER — Other Ambulatory Visit: Payer: Self-pay

## 2022-08-27 ENCOUNTER — Inpatient Hospital Stay (HOSPITAL_COMMUNITY)
Admission: EM | Admit: 2022-08-27 | Discharge: 2022-08-31 | DRG: 481 | Disposition: A | Discharge status: Skilled Nursing Facility | Payer: Medicare PPO | Attending: Internal Medicine | Admitting: Internal Medicine

## 2022-08-27 ENCOUNTER — Emergency Department (HOSPITAL_COMMUNITY): Payer: Medicare PPO

## 2022-08-27 DIAGNOSIS — R9431 Abnormal electrocardiogram [ECG] [EKG]: Secondary | ICD-10-CM | POA: Diagnosis present

## 2022-08-27 DIAGNOSIS — Z90710 Acquired absence of both cervix and uterus: Secondary | ICD-10-CM

## 2022-08-27 DIAGNOSIS — E785 Hyperlipidemia, unspecified: Secondary | ICD-10-CM | POA: Diagnosis present

## 2022-08-27 DIAGNOSIS — E119 Type 2 diabetes mellitus without complications: Secondary | ICD-10-CM | POA: Diagnosis present

## 2022-08-27 DIAGNOSIS — W19XXXA Unspecified fall, initial encounter: Secondary | ICD-10-CM

## 2022-08-27 DIAGNOSIS — Y92481 Parking lot as the place of occurrence of the external cause: Secondary | ICD-10-CM

## 2022-08-27 DIAGNOSIS — S72144A Nondisplaced intertrochanteric fracture of right femur, initial encounter for closed fracture: Secondary | ICD-10-CM

## 2022-08-27 DIAGNOSIS — I10 Essential (primary) hypertension: Secondary | ICD-10-CM | POA: Diagnosis present

## 2022-08-27 DIAGNOSIS — E871 Hypo-osmolality and hyponatremia: Secondary | ICD-10-CM | POA: Diagnosis present

## 2022-08-27 DIAGNOSIS — S72141A Displaced intertrochanteric fracture of right femur, initial encounter for closed fracture: Secondary | ICD-10-CM | POA: Diagnosis present

## 2022-08-27 DIAGNOSIS — W1809XA Striking against other object with subsequent fall, initial encounter: Secondary | ICD-10-CM | POA: Diagnosis present

## 2022-08-27 DIAGNOSIS — Z8249 Family history of ischemic heart disease and other diseases of the circulatory system: Secondary | ICD-10-CM

## 2022-08-27 DIAGNOSIS — E876 Hypokalemia: Secondary | ICD-10-CM | POA: Diagnosis present

## 2022-08-27 DIAGNOSIS — D72829 Elevated white blood cell count, unspecified: Secondary | ICD-10-CM | POA: Diagnosis present

## 2022-08-27 DIAGNOSIS — F0390 Unspecified dementia without behavioral disturbance: Secondary | ICD-10-CM | POA: Diagnosis present

## 2022-08-27 DIAGNOSIS — S72001A Fracture of unspecified part of neck of right femur, initial encounter for closed fracture: Secondary | ICD-10-CM | POA: Diagnosis not present

## 2022-08-27 HISTORY — DX: Unspecified dementia, unspecified severity, without behavioral disturbance, psychotic disturbance, mood disturbance, and anxiety: F03.90

## 2022-08-27 LAB — CBC WITH DIFFERENTIAL/PLATELET
Abs Immature Granulocytes: 0.09 10*3/uL — ABNORMAL HIGH (ref 0.00–0.07)
Basophils Absolute: 0 10*3/uL (ref 0.0–0.1)
Basophils Relative: 0 %
Eosinophils Absolute: 0 10*3/uL (ref 0.0–0.5)
Eosinophils Relative: 0 %
HCT: 41.2 % (ref 36.0–46.0)
Hemoglobin: 13.3 g/dL (ref 12.0–15.0)
Immature Granulocytes: 1 %
Lymphocytes Relative: 7 %
Lymphs Abs: 0.9 10*3/uL (ref 0.7–4.0)
MCH: 29.2 pg (ref 26.0–34.0)
MCHC: 32.3 g/dL (ref 30.0–36.0)
MCV: 90.5 fL (ref 80.0–100.0)
Monocytes Absolute: 0.8 10*3/uL (ref 0.1–1.0)
Monocytes Relative: 6 %
Neutro Abs: 11.8 10*3/uL — ABNORMAL HIGH (ref 1.7–7.7)
Neutrophils Relative %: 86 %
Platelets: 232 10*3/uL (ref 150–400)
RBC: 4.55 MIL/uL (ref 3.87–5.11)
RDW: 13.2 % (ref 11.5–15.5)
WBC: 13.6 10*3/uL — ABNORMAL HIGH (ref 4.0–10.5)
nRBC: 0 % (ref 0.0–0.2)

## 2022-08-27 MED ORDER — FENTANYL CITRATE PF 50 MCG/ML IJ SOSY
50.0000 ug | PREFILLED_SYRINGE | Freq: Once | INTRAMUSCULAR | Status: AC
  Administered 2022-08-27: 50 ug via INTRAVENOUS
  Filled 2022-08-27: qty 1

## 2022-08-27 NOTE — ED Provider Notes (Signed)
[facility] Provider Note   CSN: [id number] Arrival date & time: 08/27/22  2145     History {Add pertinent medical, surgical, social history, OB history to HPI:1} Chief Complaint  Patient presents with   Lauren Frederick is a 82 y.o. female.  The history is provided by the patient and medical records.  Fall   82 year old female with history of hypertension, diabetes, seasonal allergies, dementia, presenting to the ED after mechanical fall.  Daughter reports they were at rooms to go furniture store, she was getting out of the car and when she went to close the door it bumped her and struck her off balance, falling on right hip on pavement.  There was no head injury or loss of consciousness.  She was not able to stand and bear weight, neighbor was able to help them take her home, however pain worsened and EMS was called.  Patient reports significant pain in the right hip.  She denies any other injuries.  She is generally ambulatory without assistance, currently lives with daughter.  She is not on anticoagulation.  She did eat about 1 hour PTA to the ED.  Home Medications Prior to Admission medications   Medication Sig Start Date End Date Taking? Authorizing Provider  amitriptyline (ELAVIL) 25 MG tablet Take 25 mg by mouth at bedtime.    [provider]  atorvastatin (LIPITOR) 20 MG tablet Take 20 mg by mouth daily.    [provider]  Calcium Carbonate-Vitamin D (CALCIUM-VITAMIN D) 500-200 MG-UNIT tablet Take 1 tablet by mouth daily.    [provider]  glipiZIDE (GLUCOTROL) 5 MG tablet Take by mouth daily before breakfast.    [provider]  Omega-3 Fatty Acids (FISH OIL PO) Take by mouth.    [provider]  ondansetron (ZOFRAN) 8 MG tablet Take 1 tablet (8 mg total) by mouth every 8 (eight) hours as needed for nausea or vomiting. 04/28/15   Orma Flaming, MD  potassium chloride  (K-DUR) 10 MEQ tablet Take 1 tablet (10 mEq total) by mouth daily. 02/09/16   Hillary Bow, MD  RaNITidine HCl (ZANTAC PO) Take by mouth.    [provider]      Allergies    Aspirin, Metformin, Pork-derived products, Pravastatin, Rosuvastatin, and Simvastatin    Review of Systems   Review of Systems  Musculoskeletal:  Positive for arthralgias.  All other systems reviewed and are negative.   Physical Exam Updated Vital Signs BP (!) 146/83 (BP Location: Right Arm)   Pulse 99   Temp 98.2 F (36.8 C) (Oral)   Resp 16   Ht '5\' 5"'$  (1.651 m)   Wt 74.8 kg   SpO2 96%   BMI 27.46 kg/m  Physical Exam Vitals and nursing note reviewed.  Constitutional:      Appearance: She is well-developed.  HENT:     Head: Normocephalic and atraumatic.  Eyes:     Conjunctiva/sclera: Conjunctivae normal.     Pupils: Pupils are equal, round, and reactive to light.  Cardiovascular:     Rate and Rhythm: Normal rate and regular rhythm.     Heart sounds: Normal heart sounds.  Pulmonary:     Effort: Pulmonary effort is normal.     Breath sounds: Normal breath sounds.  Abdominal:     General: Bowel sounds are normal.     Palpations: Abdomen is soft.  Musculoskeletal:  General: Normal range of motion.     Cervical back: Normal range of motion.     Comments: Tenderness of the right hip, she attempted to move leg during exam and had severe pain, DP pulse intact, able to plantar/dorsiflex against resistance  Skin:    General: Skin is warm and dry.  Neurological:     Mental Status: She is alert and oriented to person, place, and time.     ED Results / Procedures / Treatments   Labs (all labs ordered are listed, but only abnormal results are displayed) Labs Reviewed  CBC WITH DIFFERENTIAL/PLATELET  PROTIME-INR  TYPE AND SCREEN    EKG None  Radiology DG Hip Unilat  With Pelvis 2-3 Views Right  Result Date: 08/27/2022 CLINICAL DATA:  Fall and right hip pain. EXAM: DG HIP (WITH  OR WITHOUT PELVIS) 2-3V RIGHT COMPARISON:  CT of the abdomen pelvis 02/08/2016. FINDINGS: Evaluation is limited due to osteopenia and body habitus. There is a nondisplaced intertrochanteric fracture of the right femur. No other acute fracture. The bones are osteopenic. Mild bilateral hip arthritic changes. The soft tissues are unremarkable. IMPRESSION: Nondisplaced intertrochanteric fracture of the right femur. Electronically Signed   By: Anner Crete M.D.   On: 08/27/2022 22:24    Procedures Procedures  {Document cardiac monitor, telemetry assessment procedure when appropriate:1}  Medications Ordered in ED Medications - No data to display  ED Course/ Medical Decision Making/ A&P   {   Click here for ABCD2, HEART and other calculatorsREFRESH Note before signing :1}                          Medical Decision Making Amount and/or Complexity of Data Reviewed Labs: ordered. Radiology: ordered and independent interpretation performed. ECG/medicine tests: ordered and independent interpretation performed.  Risk Prescription drug management.   81 year old female here with right hip pain after mechanical fall outside furniture store.  No head injury or LOC.  Unable to bear weight or ambulate since fall occurred.  She is AAOx3 here.  Does have tenderness and severe pain with any attempted ROM of right hip.  Leg is NVI.    10:52 PM Spoke with on call orthopedics, Dr. Lorin Mercy-- keep NPO overnight, repair in AM. Final Clinical Impression(s) / ED Diagnoses Final diagnoses:  None    Rx / DC Orders ED Discharge Orders     None

## 2022-08-27 NOTE — ED Triage Notes (Signed)
Pt presents with injuries from a ground level fall in a parking lot due to the car door knocking the pt off balance. Pt with severe R hip pain and unable to bear weight. Pt denies head injury, LOC, and is not on anticoagulants.

## 2022-08-28 ENCOUNTER — Inpatient Hospital Stay (HOSPITAL_COMMUNITY): Payer: Medicare PPO | Admitting: Anesthesiology

## 2022-08-28 ENCOUNTER — Other Ambulatory Visit: Payer: Self-pay

## 2022-08-28 ENCOUNTER — Encounter (HOSPITAL_COMMUNITY)
Admission: EM | Disposition: A | Discharge status: Skilled Nursing Facility | Payer: Self-pay | Source: Home / Self Care | Attending: Internal Medicine

## 2022-08-28 ENCOUNTER — Inpatient Hospital Stay (HOSPITAL_COMMUNITY): Payer: Medicare PPO

## 2022-08-28 ENCOUNTER — Encounter (HOSPITAL_COMMUNITY): Payer: Self-pay | Admitting: Internal Medicine

## 2022-08-28 DIAGNOSIS — E119 Type 2 diabetes mellitus without complications: Secondary | ICD-10-CM | POA: Diagnosis not present

## 2022-08-28 DIAGNOSIS — Y92009 Unspecified place in unspecified non-institutional (private) residence as the place of occurrence of the external cause: Secondary | ICD-10-CM

## 2022-08-28 DIAGNOSIS — Z90710 Acquired absence of both cervix and uterus: Secondary | ICD-10-CM | POA: Diagnosis not present

## 2022-08-28 DIAGNOSIS — S72141A Displaced intertrochanteric fracture of right femur, initial encounter for closed fracture: Secondary | ICD-10-CM

## 2022-08-28 DIAGNOSIS — W19XXXA Unspecified fall, initial encounter: Secondary | ICD-10-CM | POA: Diagnosis not present

## 2022-08-28 DIAGNOSIS — F0390 Unspecified dementia without behavioral disturbance: Secondary | ICD-10-CM | POA: Diagnosis not present

## 2022-08-28 DIAGNOSIS — W1809XA Striking against other object with subsequent fall, initial encounter: Secondary | ICD-10-CM | POA: Diagnosis present

## 2022-08-28 DIAGNOSIS — E871 Hypo-osmolality and hyponatremia: Secondary | ICD-10-CM | POA: Diagnosis present

## 2022-08-28 DIAGNOSIS — D72829 Elevated white blood cell count, unspecified: Secondary | ICD-10-CM | POA: Diagnosis present

## 2022-08-28 DIAGNOSIS — R9431 Abnormal electrocardiogram [ECG] [EKG]: Secondary | ICD-10-CM | POA: Diagnosis present

## 2022-08-28 DIAGNOSIS — Z7984 Long term (current) use of oral hypoglycemic drugs: Secondary | ICD-10-CM

## 2022-08-28 DIAGNOSIS — E785 Hyperlipidemia, unspecified: Secondary | ICD-10-CM | POA: Diagnosis present

## 2022-08-28 DIAGNOSIS — S72001A Fracture of unspecified part of neck of right femur, initial encounter for closed fracture: Secondary | ICD-10-CM | POA: Diagnosis present

## 2022-08-28 DIAGNOSIS — S72144D Nondisplaced intertrochanteric fracture of right femur, subsequent encounter for closed fracture with routine healing: Secondary | ICD-10-CM | POA: Diagnosis not present

## 2022-08-28 DIAGNOSIS — I10 Essential (primary) hypertension: Secondary | ICD-10-CM | POA: Diagnosis not present

## 2022-08-28 DIAGNOSIS — S72144A Nondisplaced intertrochanteric fracture of right femur, initial encounter for closed fracture: Secondary | ICD-10-CM | POA: Diagnosis present

## 2022-08-28 DIAGNOSIS — E876 Hypokalemia: Secondary | ICD-10-CM | POA: Diagnosis present

## 2022-08-28 DIAGNOSIS — Z8249 Family history of ischemic heart disease and other diseases of the circulatory system: Secondary | ICD-10-CM | POA: Diagnosis not present

## 2022-08-28 DIAGNOSIS — Y92481 Parking lot as the place of occurrence of the external cause: Secondary | ICD-10-CM | POA: Diagnosis not present

## 2022-08-28 HISTORY — PX: INTRAMEDULLARY (IM) NAIL INTERTROCHANTERIC: SHX5875

## 2022-08-28 LAB — PROTIME-INR
INR: 1.1 (ref 0.8–1.2)
Prothrombin Time: 14.5 seconds (ref 11.4–15.2)

## 2022-08-28 LAB — CBC WITH DIFFERENTIAL/PLATELET
Abs Immature Granulocytes: 0.05 10*3/uL (ref 0.00–0.07)
Basophils Absolute: 0 10*3/uL (ref 0.0–0.1)
Basophils Relative: 0 %
Eosinophils Absolute: 0 10*3/uL (ref 0.0–0.5)
Eosinophils Relative: 0 %
HCT: 38.2 % (ref 36.0–46.0)
Hemoglobin: 12.4 g/dL (ref 12.0–15.0)
Immature Granulocytes: 0 %
Lymphocytes Relative: 13 %
Lymphs Abs: 1.5 10*3/uL (ref 0.7–4.0)
MCH: 29.7 pg (ref 26.0–34.0)
MCHC: 32.5 g/dL (ref 30.0–36.0)
MCV: 91.4 fL (ref 80.0–100.0)
Monocytes Absolute: 0.9 10*3/uL (ref 0.1–1.0)
Monocytes Relative: 8 %
Neutro Abs: 9 10*3/uL — ABNORMAL HIGH (ref 1.7–7.7)
Neutrophils Relative %: 79 %
Platelets: 214 10*3/uL (ref 150–400)
RBC: 4.18 MIL/uL (ref 3.87–5.11)
RDW: 13.2 % (ref 11.5–15.5)
WBC: 11.4 10*3/uL — ABNORMAL HIGH (ref 4.0–10.5)
nRBC: 0 % (ref 0.0–0.2)

## 2022-08-28 LAB — COMPREHENSIVE METABOLIC PANEL
ALT: 22 U/L (ref 0–44)
ALT: 23 U/L (ref 0–44)
AST: 31 U/L (ref 15–41)
AST: 28 U/L (ref 15–41)
Albumin: 3.8 g/dL (ref 3.5–5.0)
Albumin: 3.9 g/dL (ref 3.5–5.0)
Alkaline Phosphatase: 65 U/L (ref 38–126)
Alkaline Phosphatase: 69 U/L (ref 38–126)
Anion gap: 12 (ref 5–15)
Anion gap: 10 (ref 5–15)
BUN: 11 mg/dL (ref 8–23)
BUN: 11 mg/dL (ref 8–23)
CO2: 23 mmol/L (ref 22–32)
CO2: 26 mmol/L (ref 22–32)
Calcium: 8.6 mg/dL — ABNORMAL LOW (ref 8.9–10.3)
Calcium: 8.6 mg/dL — ABNORMAL LOW (ref 8.9–10.3)
Chloride: 98 mmol/L (ref 98–111)
Chloride: 97 mmol/L — ABNORMAL LOW (ref 98–111)
Creatinine, Ser: 0.82 mg/dL (ref 0.44–1.00)
Creatinine, Ser: 0.78 mg/dL (ref 0.44–1.00)
GFR, Estimated: >60 mL/min (ref >60)
GFR, Estimated: >60 mL/min (ref >60)
Glucose, Bld: 196 mg/dL — ABNORMAL HIGH (ref 70–99)
Glucose, Bld: 209 mg/dL — ABNORMAL HIGH (ref 70–99)
Potassium: 3.5 mmol/L (ref 3.5–5.1)
Potassium: 3.4 mmol/L — ABNORMAL LOW (ref 3.5–5.1)
Sodium: 133 mmol/L — ABNORMAL LOW (ref 135–145)
Sodium: 133 mmol/L — ABNORMAL LOW (ref 135–145)
Total Bilirubin: 0.6 mg/dL (ref 0.3–1.2)
Total Bilirubin: 0.4 mg/dL (ref 0.3–1.2)
Total Protein: 7 g/dL (ref 6.5–8.1)
Total Protein: 7.1 g/dL (ref 6.5–8.1)

## 2022-08-28 LAB — MAGNESIUM: Magnesium: 2.4 mg/dL (ref 1.7–2.4)

## 2022-08-28 LAB — URINALYSIS, COMPLETE (UACMP) WITH MICROSCOPIC
Bilirubin Urine: NEGATIVE
Glucose, UA: NEGATIVE mg/dL
Hgb urine dipstick: NEGATIVE
Ketones, ur: NEGATIVE mg/dL
Leukocytes,Ua: NEGATIVE
Nitrite: NEGATIVE
Protein, ur: NEGATIVE mg/dL
Specific Gravity, Urine: 1.005 (ref 1.005–1.030)
pH: 7 (ref 5.0–8.0)

## 2022-08-28 LAB — VITAMIN D 25 HYDROXY (VIT D DEFICIENCY, FRACTURES): Vit D, 25-Hydroxy: 35.92 ng/mL (ref 30–100)

## 2022-08-28 LAB — TYPE AND SCREEN
ABO/RH(D): B POS
Antibody Screen: NEGATIVE

## 2022-08-28 LAB — GLUCOSE, CAPILLARY
Glucose-Capillary: 168 mg/dL — ABNORMAL HIGH (ref 70–99)
Glucose-Capillary: 147 mg/dL — ABNORMAL HIGH (ref 70–99)
Glucose-Capillary: 148 mg/dL — ABNORMAL HIGH (ref 70–99)
Glucose-Capillary: 173 mg/dL — ABNORMAL HIGH (ref 70–99)
Glucose-Capillary: 190 mg/dL — ABNORMAL HIGH (ref 70–99)

## 2022-08-28 LAB — SURGICAL PCR SCREEN
MRSA, PCR: NEGATIVE
Staphylococcus aureus: NEGATIVE

## 2022-08-28 SURGERY — FIXATION, FRACTURE, INTERTROCHANTERIC, WITH INTRAMEDULLARY ROD
Anesthesia: General | Site: Hip | Laterality: Right

## 2022-08-28 MED ORDER — OXYCODONE HCL 5 MG PO TABS: 5.0000 mg | ORAL_TABLET | Freq: Once | ORAL | Status: DC | PRN

## 2022-08-28 MED ORDER — INSULIN ASPART 100 UNIT/ML IJ SOLN
0.0000 [IU] | Freq: Three times a day (TID) | INTRAMUSCULAR | Status: DC
  Administered 2022-08-28 – 2022-08-30 (×5): 2 [IU] via SUBCUTANEOUS

## 2022-08-28 MED ORDER — ONDANSETRON HCL 4 MG/2ML IJ SOLN
4.0000 mg | Freq: Four times a day (QID) | INTRAMUSCULAR | Status: DC | PRN
  Administered 2022-08-28: 4 mg via INTRAVENOUS
  Filled 2022-08-28: qty 2

## 2022-08-28 MED ORDER — SUGAMMADEX SODIUM 200 MG/2ML IV SOLN
INTRAVENOUS | Status: DC | PRN
  Administered 2022-08-28: 190 mg via INTRAVENOUS

## 2022-08-28 MED ORDER — LACTATED RINGERS IV SOLN: INTRAVENOUS | Status: DC | PRN

## 2022-08-28 MED ORDER — ORAL CARE MOUTH RINSE: 15.0000 mL | OROMUCOSAL | Status: DC | PRN

## 2022-08-28 MED ORDER — ACETAMINOPHEN 10 MG/ML IV SOLN
INTRAVENOUS | Status: AC
  Filled 2022-08-28: qty 100

## 2022-08-28 MED ORDER — TRANEXAMIC ACID-NACL 1000-0.7 MG/100ML-% IV SOLN
1000.0000 mg | Freq: Once | INTRAVENOUS | Status: AC
  Administered 2022-08-28: 1000 mg via INTRAVENOUS
  Filled 2022-08-28: qty 100

## 2022-08-28 MED ORDER — MEPERIDINE HCL 50 MG/ML IJ SOLN: 6.2500 mg | INTRAMUSCULAR | Status: DC | PRN

## 2022-08-28 MED ORDER — ONDANSETRON HCL 4 MG/2ML IJ SOLN
INTRAMUSCULAR | Status: DC | PRN
  Administered 2022-08-28: 4 mg via INTRAVENOUS

## 2022-08-28 MED ORDER — ACETAMINOPHEN 160 MG/5ML PO SOLN: 325.0000 mg | ORAL | Status: DC | PRN

## 2022-08-28 MED ORDER — ACETAMINOPHEN 650 MG RE SUPP: 650.0000 mg | Freq: Four times a day (QID) | RECTAL | Status: DC | PRN

## 2022-08-28 MED ORDER — BUPIVACAINE LIPOSOME 1.3 % IJ SUSP
INTRAMUSCULAR | Status: DC | PRN
  Administered 2022-08-28: 10 mL

## 2022-08-28 MED ORDER — ACETAMINOPHEN 325 MG PO TABS: 325.0000 mg | ORAL_TABLET | ORAL | Status: DC | PRN

## 2022-08-28 MED ORDER — FENTANYL CITRATE PF 50 MCG/ML IJ SOSY
PREFILLED_SYRINGE | INTRAMUSCULAR | Status: AC
  Filled 2022-08-28: qty 1

## 2022-08-28 MED ORDER — POTASSIUM CHLORIDE CRYS ER 20 MEQ PO TBCR
40.0000 meq | EXTENDED_RELEASE_TABLET | Freq: Once | ORAL | Status: AC
  Administered 2022-08-28: 40 meq via ORAL
  Filled 2022-08-28: qty 2

## 2022-08-28 MED ORDER — CEFAZOLIN SODIUM-DEXTROSE 2-4 GM/100ML-% IV SOLN
INTRAVENOUS | Status: AC
  Filled 2022-08-28: qty 100

## 2022-08-28 MED ORDER — HYDROMORPHONE HCL 1 MG/ML IJ SOLN
0.5000 mg | INTRAMUSCULAR | Status: DC | PRN
  Administered 2022-08-28 – 2022-08-29 (×4): 0.5 mg via INTRAVENOUS
  Filled 2022-08-28 (×4): qty 0.5

## 2022-08-28 MED ORDER — FENTANYL CITRATE PF 50 MCG/ML IJ SOSY
25.0000 ug | PREFILLED_SYRINGE | INTRAMUSCULAR | Status: DC | PRN
  Administered 2022-08-28: 25 ug via INTRAVENOUS
  Filled 2022-08-28: qty 1

## 2022-08-28 MED ORDER — FENTANYL CITRATE (PF) 100 MCG/2ML IJ SOLN
INTRAMUSCULAR | Status: DC | PRN
  Administered 2022-08-28 (×2): 50 ug via INTRAVENOUS

## 2022-08-28 MED ORDER — CHLORHEXIDINE GLUCONATE 4 % EX LIQD
60.0000 mL | Freq: Once | CUTANEOUS | Status: AC
  Administered 2022-08-28: 4 via TOPICAL

## 2022-08-28 MED ORDER — ROCURONIUM BROMIDE 10 MG/ML (PF) SYRINGE
PREFILLED_SYRINGE | INTRAVENOUS | Status: AC
  Filled 2022-08-28: qty 10

## 2022-08-28 MED ORDER — TRANEXAMIC ACID-NACL 1000-0.7 MG/100ML-% IV SOLN
INTRAVENOUS | Status: AC
  Filled 2022-08-28: qty 100

## 2022-08-28 MED ORDER — PHENOL 1.4 % MT LIQD: 1.0000 | OROMUCOSAL | Status: DC | PRN

## 2022-08-28 MED ORDER — ACETAMINOPHEN 325 MG PO TABS
650.0000 mg | ORAL_TABLET | Freq: Four times a day (QID) | ORAL | Status: DC | PRN
  Administered 2022-08-28 – 2022-08-31 (×5): 650 mg via ORAL
  Filled 2022-08-28 (×6): qty 2

## 2022-08-28 MED ORDER — AMLODIPINE BESYLATE 5 MG PO TABS
5.0000 mg | ORAL_TABLET | Freq: Every day | ORAL | Status: DC
  Administered 2022-08-28 – 2022-08-31 (×4): 5 mg via ORAL
  Filled 2022-08-28 (×4): qty 1

## 2022-08-28 MED ORDER — MENTHOL 3 MG MT LOZG: 1.0000 | LOZENGE | OROMUCOSAL | Status: DC | PRN

## 2022-08-28 MED ORDER — PROPOFOL 10 MG/ML IV BOLUS
INTRAVENOUS | Status: DC | PRN
  Administered 2022-08-28: 100 mg via INTRAVENOUS

## 2022-08-28 MED ORDER — BUPIVACAINE LIPOSOME 1.3 % IJ SUSP
INTRAMUSCULAR | Status: AC
  Filled 2022-08-28: qty 10

## 2022-08-28 MED ORDER — SODIUM CHLORIDE 0.45 % IV SOLN: INTRAVENOUS | Status: DC

## 2022-08-28 MED ORDER — FENTANYL CITRATE PF 50 MCG/ML IJ SOSY
25.0000 ug | PREFILLED_SYRINGE | INTRAMUSCULAR | Status: DC | PRN
  Administered 2022-08-28: 50 ug via INTRAVENOUS

## 2022-08-28 MED ORDER — POVIDONE-IODINE 10 % EX SWAB: 2.0000 | Freq: Once | CUTANEOUS | Status: DC

## 2022-08-28 MED ORDER — ENOXAPARIN SODIUM 40 MG/0.4ML IJ SOSY
40.0000 mg | PREFILLED_SYRINGE | INTRAMUSCULAR | Status: DC
  Administered 2022-08-29 – 2022-08-31 (×3): 40 mg via SUBCUTANEOUS
  Filled 2022-08-28 (×3): qty 0.4

## 2022-08-28 MED ORDER — FENTANYL CITRATE (PF) 100 MCG/2ML IJ SOLN
INTRAMUSCULAR | Status: AC
  Filled 2022-08-28: qty 2

## 2022-08-28 MED ORDER — BUPIVACAINE HCL (PF) 0.5 % IJ SOLN
INTRAMUSCULAR | Status: DC | PRN
  Administered 2022-08-28: 10 mL

## 2022-08-28 MED ORDER — METOCLOPRAMIDE HCL 5 MG PO TABS
5.0000 mg | ORAL_TABLET | Freq: Three times a day (TID) | ORAL | Status: DC | PRN
  Administered 2022-08-30: 10 mg via ORAL
  Filled 2022-08-28: qty 2

## 2022-08-28 MED ORDER — METOCLOPRAMIDE HCL 5 MG/ML IJ SOLN: 5.0000 mg | Freq: Three times a day (TID) | INTRAMUSCULAR | Status: DC | PRN

## 2022-08-28 MED ORDER — POLYETHYLENE GLYCOL 3350 17 G PO PACK: 17.0000 g | PACK | Freq: Every day | ORAL | Status: DC | PRN

## 2022-08-28 MED ORDER — 0.9 % SODIUM CHLORIDE (POUR BTL) OPTIME
TOPICAL | Status: DC | PRN
  Administered 2022-08-28: 1000 mL

## 2022-08-28 MED ORDER — TRANEXAMIC ACID-NACL 1000-0.7 MG/100ML-% IV SOLN
1000.0000 mg | INTRAVENOUS | Status: AC
  Administered 2022-08-28: 1000 mg via INTRAVENOUS

## 2022-08-28 MED ORDER — BUPIVACAINE HCL (PF) 0.5 % IJ SOLN
INTRAMUSCULAR | Status: AC
  Filled 2022-08-28: qty 30

## 2022-08-28 MED ORDER — DOCUSATE SODIUM 100 MG PO CAPS
100.0000 mg | ORAL_CAPSULE | Freq: Two times a day (BID) | ORAL | Status: DC
  Administered 2022-08-28 – 2022-08-31 (×6): 100 mg via ORAL
  Filled 2022-08-28 (×6): qty 1

## 2022-08-28 MED ORDER — INSULIN ASPART 100 UNIT/ML IJ SOLN: 0.0000 [IU] | Freq: Every day | INTRAMUSCULAR | Status: DC

## 2022-08-28 MED ORDER — CEFAZOLIN SODIUM-DEXTROSE 2-4 GM/100ML-% IV SOLN
2.0000 g | INTRAVENOUS | Status: AC
  Administered 2022-08-28: 2 g via INTRAVENOUS

## 2022-08-28 MED ORDER — DEXAMETHASONE SODIUM PHOSPHATE 10 MG/ML IJ SOLN
INTRAMUSCULAR | Status: DC | PRN
  Administered 2022-08-28: 4 mg via INTRAVENOUS

## 2022-08-28 MED ORDER — ROCURONIUM BROMIDE 10 MG/ML (PF) SYRINGE
PREFILLED_SYRINGE | INTRAVENOUS | Status: DC | PRN
  Administered 2022-08-28: 60 mg via INTRAVENOUS

## 2022-08-28 MED ORDER — ACETAMINOPHEN 10 MG/ML IV SOLN
INTRAVENOUS | Status: DC | PRN
  Administered 2022-08-28: 1000 mg via INTRAVENOUS

## 2022-08-28 MED ORDER — LIDOCAINE 2% (20 MG/ML) 5 ML SYRINGE
INTRAMUSCULAR | Status: DC | PRN
  Administered 2022-08-28: 80 mg via INTRAVENOUS

## 2022-08-28 MED ORDER — OXYCODONE HCL 5 MG PO TABS
5.0000 mg | ORAL_TABLET | ORAL | Status: DC | PRN
  Administered 2022-08-28 – 2022-08-31 (×9): 5 mg via ORAL
  Filled 2022-08-28 (×11): qty 1

## 2022-08-28 MED ORDER — INSULIN ASPART 100 UNIT/ML IJ SOLN
0.0000 [IU] | Freq: Four times a day (QID) | INTRAMUSCULAR | Status: DC
  Administered 2022-08-28: 1 [IU] via SUBCUTANEOUS

## 2022-08-28 MED ORDER — OXYCODONE HCL 5 MG/5ML PO SOLN: 5.0000 mg | Freq: Once | ORAL | Status: DC | PRN

## 2022-08-28 SURGICAL SUPPLY — 39 items
BAG COUNTER SPONGE SURGICOUNT (BAG) IMPLANT
BIT DRILL CANN LG 4.3MM (BIT) IMPLANT
BNDG COHESIVE 6X5 TAN ST LF (GAUZE/BANDAGES/DRESSINGS) ×1 IMPLANT
COVER PERINEAL POST (MISCELLANEOUS) ×1 IMPLANT
COVER SURGICAL LIGHT HANDLE (MISCELLANEOUS) ×1 IMPLANT
DRAPE C-ARM 42X120 X-RAY (DRAPES) ×1 IMPLANT
DRAPE C-ARMOR (DRAPES) ×1 IMPLANT
DRAPE STERI IOBAN 125X83 (DRAPES) ×1 IMPLANT
DRESSING MEPILEX FLEX 4X4 (GAUZE/BANDAGES/DRESSINGS) IMPLANT
DRILL BIT CANN LG 4.3MM (BIT) ×1
DRSG MEPILEX FLEX 4X4 (GAUZE/BANDAGES/DRESSINGS) ×2
DURAPREP 26ML APPLICATOR (WOUND CARE) IMPLANT
ELECT REM PT RETURN 15FT ADLT (MISCELLANEOUS) ×1 IMPLANT
GAUZE PAD ABD 8X10 STRL (GAUZE/BANDAGES/DRESSINGS) ×1 IMPLANT
GAUZE SPONGE 4X4 12PLY STRL (GAUZE/BANDAGES/DRESSINGS) ×1 IMPLANT
GLOVE INDICATOR 8.0 STRL GRN (GLOVE) ×1 IMPLANT
GLOVE ORTHO TXT STRL SZ7.5 (GLOVE) ×1 IMPLANT
GOWN STRL REUS W/ TWL XL LVL3 (GOWN DISPOSABLE) ×1 IMPLANT
GOWN STRL REUS W/TWL XL LVL3 (GOWN DISPOSABLE) ×1
GUIDEPIN VERSANAIL DSP 3.2X444 (ORTHOPEDIC DISPOSABLE SUPPLIES) IMPLANT
KIT BASIN OR (CUSTOM PROCEDURE TRAY) ×1 IMPLANT
KIT TURNOVER KIT A (KITS) IMPLANT
MANIFOLD NEPTUNE II (INSTRUMENTS) ×1 IMPLANT
NAIL HIP FRACT 130D 9X180 (Orthopedic Implant) IMPLANT
NDL HYPO 22X1.5 SAFETY MO (MISCELLANEOUS) ×1 IMPLANT
NEEDLE HYPO 22X1.5 SAFETY MO (MISCELLANEOUS) ×1 IMPLANT
NEEDLE SAFETY HYPO 22GAX1.5 (MISCELLANEOUS) ×1
PACK GENERAL/GYN (CUSTOM PROCEDURE TRAY) ×1 IMPLANT
PAD CAST 4YDX4 CTTN HI CHSV (CAST SUPPLIES) ×1 IMPLANT
PADDING CAST COTTON 4X4 STRL (CAST SUPPLIES) ×1
PROTECTOR NERVE ULNAR (MISCELLANEOUS) ×2 IMPLANT
SCREW BONE CORTICAL 5.0X36 (Screw) IMPLANT
SCREW CANN THRD AFF 10.5X100 (Screw) IMPLANT
STAPLER VISISTAT 35W (STAPLE) ×1 IMPLANT
SUT VIC AB 1 CT1 36 (SUTURE) ×2 IMPLANT
SUT VIC AB 2-0 CT1 27 (SUTURE) ×1
SUT VIC AB 2-0 CT1 TAPERPNT 27 (SUTURE) ×1 IMPLANT
SYR 20ML LL LF (SYRINGE) ×1 IMPLANT
TOWEL OR 17X26 10 PK STRL BLUE (TOWEL DISPOSABLE) ×2 IMPLANT

## 2022-08-28 NOTE — Anesthesia Procedure Notes (Signed)
Procedure Name: Intubation Date/Time: 08/28/2022 10:21 AM  Performed by: Lavina Hamman, CRNAPre-anesthesia Checklist: Patient identified, Emergency Drugs available, Suction available, Patient being monitored and Timeout performed Patient Re-evaluated:Patient Re-evaluated prior to induction Oxygen Delivery Method: Circle system utilized Preoxygenation: Pre-oxygenation with 100% oxygen Induction Type: IV induction Ventilation: Mask ventilation without difficulty Laryngoscope Size: Mac and 4 Grade View: Grade II Tube type: Oral Tube size: 7.5 mm Number of attempts: 1 Airway Equipment and Method: Stylet Placement Confirmation: ETT inserted through vocal cords under direct vision, positive ETCO2, CO2 detector and breath sounds checked- equal and bilateral Secured at: 22 cm Tube secured with: Tape Dental Injury: Teeth and Oropharynx as per pre-operative assessment  Comments: ATOI

## 2022-08-28 NOTE — Anesthesia Postprocedure Evaluation (Signed)
Anesthesia Post Note  Patient: SAMATHA LOMBERA  Procedure(s) Performed: INTRAMEDULLARY (IM) NAIL INTERTROCHANTERIC (Right: Hip)     Patient location during evaluation: PACU Anesthesia Type: General Level of consciousness: awake and alert Pain management: pain level controlled Vital Signs Assessment: post-procedure vital signs reviewed and stable Respiratory status: spontaneous breathing, nonlabored ventilation, respiratory function stable and patient connected to nasal cannula oxygen Cardiovascular status: blood pressure returned to baseline and stable Postop Assessment: no apparent nausea or vomiting Anesthetic complications: no   No notable events documented.  Last Vitals:  Vitals:   08/28/22 1138 08/28/22 1216  BP:  (!) 143/75  Pulse: (!) 103 95  Resp: 19 18  Temp:  37 C  SpO2: 97% 98%    Last Pain:  Vitals:   08/28/22 1115  TempSrc:   PainSc: Asleep                 Maki Sweetser

## 2022-08-28 NOTE — Interval H&P Note (Signed)
History and Physical Interval Note:  08/28/2022 9:44 AM  Lauren Frederick  has presented today for surgery, with the diagnosis of right intertrochanteric fracture.  The various methods of treatment have been discussed with the patient and family. After consideration of risks, benefits and other options for treatment, the patient has consented to  Procedure(s) with comments: INTRAMEDULLARY (IM) NAIL INTERTROCHANTERIC (Right) - hana table, Biomet affixus nail as a surgical intervention.  The patient's history has been reviewed, patient examined, no change in status, stable for surgery.  I have reviewed the patient's chart and labs.  Questions were answered to the patient's satisfaction.     Marybelle Killings

## 2022-08-28 NOTE — Progress Notes (Addendum)
[facility]                                                                              Lauren Frederick, is a 82 y.o. female, DOB - April 28, 1941, [id number] Admit date - 08/27/2022    Outpatient Primary MD for the patient is Lauren Late, MD  LOS - 0  days  Chief Complaint  Patient presents with   Fall            Brief summary   Patient is a 82 year old female with diabetes type 2, HTN, dementia presented with a ground-level fall in a parking lot due to the car door knocking the patient off balance.   Patient had severe right hip pain and unable to bear weight.  No dizziness, lightheadedness, LOC or syncopal episode. EMS was called, x-ray showed right nondisplaced intertrochanteric hip fracture Patient was admitted for workup.  Assessment & Plan    Principal Problem:   Closed nondisplaced intertrochanteric fracture of right femur ([place]),  mechanical fall -N.p.o., orthopedics consulted, plan for surgery today -Continue pain control, DVT phylaxis per Ortho   Active Problems:   Fall , initial encounter -PT OT evaluation -Per patient lives at home with her daughter  Mild hyponatremia, hypokalemia -Potassium replaced -Follow bmet    Leukocytosis -Improving, likely reactive -UA negative for UTI.  Chest x-ray negative for any pneumonia    Prolonged QT interval -QTc very prolonged 645 -Hold amitriptyline, Aricept, keep K >4, mg ~2 -Avoid QT prolonging meds    Essential hypertension -BP currently stable, resume amlodipine, lisinopril    DM2 (diabetes mellitus, type 2) (HCC) -Hold glipizide, obtain hemoglobin A1c -Placed on sliding scale insulin while inpatient  Hyperlipidemia -Continue Zetia  History of dementia -Hold Aricept due to QT prolongation  Estimated body mass index is 27.4 kg/m as calculated from the following:   Height as of this encounter: '5\' 5"'$  (1.651 m).   Weight as of this encounter: 74.7 kg.  Code Status: Full code DVT  Prophylaxis:  SCDs Start: 08/28/22 0036   Level of Care: Level of care: Med-Surg Family Communication: Updated patient Disposition Plan:      Remains inpatient appropriate: Await PT evaluation after surgery   Procedures:    Consultants:   Orthopedics  Antimicrobials:   Anti-infectives (From admission, onward)    Start     Dose/Rate Route Frequency Ordered Stop   08/28/22 0849  ceFAZolin (ANCEF) 2-4 GM/100ML-% IVPB       Note to Pharmacy: Lauren Frederick M: cabinet override      08/28/22 0849 08/28/22 2059   08/28/22 0815  ceFAZolin (ANCEF) IVPB 2g/100 mL premix        2 g 200 mL/hr over 30 Minutes Intravenous On call to O.R. 08/28/22 0729 08/29/22 0559          Medications  [MAR Hold] insulin aspart  0-6 Units Subcutaneous Q6H   [MAR Hold] potassium chloride  40 mEq Oral Once   povidone-iodine  2 Application Topical Once      Subjective:   Lauren Frederick was seen and examined today.  No acute complaints, seen in the morning.  Pain  is controlled.  Patient denies dizziness, chest pain, shortness of breath, abdominal pain, N/V/D/C, new weakness.  Objective:   Vitals:   08/28/22 0127 08/28/22 0326 08/28/22 0422 08/28/22 0857  BP: (!) 161/80  (!) 145/76 (!) 143/80  Pulse: (!) 105  (!) 103   Resp: '19  18 15  '$ Temp: 97.8 F (36.6 C)  98.4 F (36.9 C)   TempSrc: Oral  Oral   SpO2: 98%  95%   Weight:  74.7 kg    Height:       No intake or output data in the 24 hours ending 08/28/22 0941   Wt Readings from Last 3 Encounters:  08/28/22 74.7 kg  02/08/16 74.8 kg  04/28/15 72.8 kg     Exam General: Alert and oriented x 3, NAD Cardiovascular: S1 S2 auscultated,  RRR Respiratory: Clear to auscultation bilaterally, no wheezing Gastrointestinal: Soft, nontender, nondistended, + bowel sounds Ext: no pedal edema bilaterally Neuro: no new deficits Psych: Normal affect     Data Reviewed:  I have personally reviewed following labs    CBC Lab Results  Component  Value Date   WBC 11.4 (H) 08/28/2022   RBC 4.18 08/28/2022   HGB 12.4 08/28/2022   HCT 38.2 08/28/2022   MCV 91.4 08/28/2022   MCH 29.7 08/28/2022   PLT 214 08/28/2022   MCHC 32.5 08/28/2022   RDW 13.2 08/28/2022   LYMPHSABS 1.5 08/28/2022   MONOABS 0.9 08/28/2022   EOSABS 0.0 08/28/2022   BASOSABS 0.0 123456     Last metabolic panel Lab Results  Component Value Date   NA 133 (L) 08/28/2022   K 3.4 (L) 08/28/2022   CL 97 (L) 08/28/2022   CO2 26 08/28/2022   BUN 11 08/28/2022   CREATININE 0.78 08/28/2022   GLUCOSE 209 (H) 08/28/2022   GFRNONAA >60 08/28/2022   GFRAA >60 02/09/2016   CALCIUM 8.6 (L) 08/28/2022   PROT 7.1 08/28/2022   ALBUMIN 3.9 08/28/2022   BILITOT 0.4 08/28/2022   ALKPHOS 69 08/28/2022   AST 28 08/28/2022   ALT 23 08/28/2022   ANIONGAP 10 08/28/2022    CBG (last 3)  Recent Labs    08/28/22 0621  GLUCAP 168*      Coagulation Profile: Recent Labs  Lab 08/27/22 2316  INR 1.1     Radiology Studies: I have personally reviewed the imaging studies  DG Chest 1 View  Result Date: 08/28/2022 CLINICAL DATA:  Hip fracture.  Preop EXAM: CHEST  1 VIEW COMPARISON:  Report from radiograph Nov 13, 1996 FINDINGS: Normal cardiomediastinal silhouette. No focal consolidation, pleural effusion, or pneumothorax. No acute osseous abnormality. IMPRESSION: No active disease. Electronically Signed   By: Lauren Frederick M.D.   On: 08/28/2022 00:10   DG Hip Unilat  With Pelvis 2-3 Views Right  Result Date: 08/27/2022 CLINICAL DATA:  Fall and right hip pain. EXAM: DG HIP (WITH OR WITHOUT PELVIS) 2-3V RIGHT COMPARISON:  CT of the abdomen pelvis 02/08/2016. FINDINGS: Evaluation is limited due to osteopenia and body habitus. There is a nondisplaced intertrochanteric fracture of the right femur. No other acute fracture. The bones are osteopenic. Mild bilateral hip arthritic changes. The soft tissues are unremarkable. IMPRESSION: Nondisplaced intertrochanteric fracture  of the right femur. Electronically Signed   By: Lauren Frederick M.D.   On: 08/27/2022 22:24       Lauren Frederick M.D. [facility] 08/28/2022, 9:41 AM  Available via Epic secure chat 7am-7pm After 7 pm, please refer to night coverage  provider listed on amion.

## 2022-08-28 NOTE — Op Note (Signed)
Pre and postop diagnosis: Right intertrochanteric hip fracture  Procedure: Right trochanteric intramedullary nail for right intertrochanteric hip fracture.  Surgeon: Rodell Perna, MD  Anesthesia: General orotracheal  EBL: Less than 200 cc  Implants:NAIL HIP FRACT 130D 9X180 - [id number]  Inventory Item: NAIL HIP FRACT 130D 9X180 Serial no.: Model/Cat no.: [id number]  Implant name: NAIL HIP FRACT 130D 9X180 - [id number] Laterality: Right Area: Hip  Manufacturer: [employer](ORTH,TRAU,BIO,SG) Date of Manufacture:   Action: Implanted Number Used: 1   Device Identifier: Device Identifier TypeKennon Portela THRD AFF 10.5X100 - [id number]  Inventory Item: Kennon Portela THRD AFF 10.5X100 Serial no.: Model/Cat no.: [id number]  Implant name: Kennon Portela [facility] AFF 10.5X100 - [id number] Laterality: Right Area: Hip  Manufacturer: [employer](ORTH,TRAU,BIO,SG) Date of Manufacture:   Action: Implanted Number Used: 1   Device Identifier: Device Identifier Type:    Trays Procedure: After induction general anesthesia Foley catheter placement Hana boot for the right leg patient was transferred to the Hana table.  Right foot was secured central post placed well leg holder opposite left leg she had limited abduction which was noted when there was trying to put the Foley catheter in place.  C-arm was able to visualize the nondisplaced intertrochanteric hip fracture and DuraPrep was used preoperative Ancef and IV TXA.  Anesthesia decided not to do a hip fracture nerve block preoperatively.  After a usual shower curtain drape after scoring the hours with towels large Steri-Drape Betadine cover, procedure completed.  Incision was made proximal trochanter tip the trochanter was palpated Steinmann pin was placed checked under C arm drilled them and then reamed with the cannulated starter.  9 mm short affixes nail was inserted carefully rotated in advanced appropriate position and then pin was drilled center center up the  head drilling insertion of the 100 mm screw with good bite.  Locked and proximally and then a distal 36 mm bicortical screw was inserted confirmed under fluoroscopy bicortical through the whole with good securement.  Final spot pictures were taken for documentation.  Copious irrigation Exparel Marcaine 10+10 total of 20 cc was infiltrated for postoperative analgesia since patient did not have a nerve block.  Gluteus medius fascia closed with #1 Vicryl 2-0 Vicryl subcutaneous tissue skin staple closure and postop dressing.  Patient tolerated procedure well transferred to care room in stable condition.

## 2022-08-28 NOTE — Plan of Care (Signed)
  Problem: Coping: Goal: Level of anxiety will decrease Outcome: Progressing   Problem: Safety: Goal: Ability to remain free from injury will improve Outcome: Progressing   Problem: Pain Managment: Goal: General experience of comfort will improve Outcome: Progressing   

## 2022-08-28 NOTE — Anesthesia Preprocedure Evaluation (Addendum)
Anesthesia Evaluation  Patient identified by MRN, date of birth, ID band Patient awake    Reviewed: Allergy & Precautions, H&P , NPO status , Patient's Chart, lab work & pertinent test results  Airway Mallampati: I  TM Distance: >3 FB Neck ROM: Full    Dental no notable dental hx. (+) Poor Dentition, Missing, Dental Advisory Given,    Pulmonary neg pulmonary ROS   Pulmonary exam normal breath sounds clear to auscultation       Cardiovascular Exercise Tolerance: Good hypertension, Pt. on medications Normal cardiovascular exam Rhythm:Regular Rate:Normal     Neuro/Psych  PSYCHIATRIC DISORDERS     Dementia    GI/Hepatic negative GI ROS,,,  Endo/Other  diabetes, Type 2    Renal/GU      Musculoskeletal  (+) Arthritis , Osteoarthritis,    Abdominal   Peds negative pediatric ROS (+)  Hematology   Anesthesia Other Findings   Reproductive/Obstetrics negative OB ROS                             Anesthesia Physical Anesthesia Plan  ASA: 3 and emergent  Anesthesia Plan: General   Post-op Pain Management: Minimal or no pain anticipated   Induction: Intravenous  PONV Risk Score and Plan: 3 and Ondansetron, Dexamethasone and Treatment may vary due to age or medical condition  Airway Management Planned: Oral ETT and LMA  Additional Equipment: None  Intra-op Plan:   Post-operative Plan: Extubation in OR  Informed Consent: I have reviewed the patients History and Physical, chart, labs and discussed the procedure including the risks, benefits and alternatives for the proposed anesthesia with the patient or authorized representative who has indicated his/her understanding and acceptance.       Plan Discussed with: Anesthesiologist and CRNA  Anesthesia Plan Comments: (  )        Anesthesia Quick Evaluation

## 2022-08-28 NOTE — H&P (Signed)
History and Physical      Lauren Frederick [id number] DOB: 1940/07/05 DOA: 08/27/2022  PCP: Derinda Late, MD  Patient coming from: home   I have personally briefly reviewed patient's old medical records in [place]  Chief Complaint: fall  HPI: Lauren Frederick is a 82 y.o. female with medical history significant for type 2 diabetes mellitus, essential hypertension, dementia, who is admitted to [facility] on 08/27/2022 with acute right intertrochanteric right femur fracture after presenting from home to [facility] ED complaining of fall.   Patient reports tripping while attempting to ambulate in the parking lot of a local furniture store, resulting in a ground level fall during which right hip was the principal point of contact with the floor below. As a result of this fall, the patient reports immediate development of sharp right hip pain, with radiation into the right groin. States that this pain has been constant since onset, with exacerbation when attempting to move the right lower extremity.  As a consequence of the associated intensity of this discomfort, reports that he is unable to bear weight on the right lower extremity at this time.  This is relative to baseline functional status in which the patient lives with her daughter, but is able to, at baseline, ambulate without assistance, including no need for support devices.  Otherwise, denies any acute arthralgias or myalgias as a result of the above fall.  Denies any acute numbness or paresthesias in bilateral lower extremities, and confirms that right hip representations a native joint.  Fall was witnessed by the patient's daughter.  Patient did not hit head as a component of this fall, and denies any associated loss of consciousness.  Denies any preceding or associated chest pain, shortness of breath, diaphoresis, palpitations, nausea, vomiting, dizziness, presyncope, or syncope.  Denies any subsequent headache, neck pain, blurry  vision, or diplopia.  Not on any blood thinners as an outpatient, including no aspirin.  Denies any known history of coronary artery disease or CHF. denies any recent orthopnea, PND, or peripheral edema.       ED Course:  Vital signs in the ED were notable for the following: Afebrile; heart rate [id number] 05; systolic blood pressures in the 140s to 160s; respiratory rate 16-19, oxygen saturation 96 to 98% on room air.  Labs were notable for the following: CMP notable for sodium 133, which corrects to approximately 135 when taking into account concomitant hyperglycemia bicarbonate 23, creatinine 0.82, glucose 196, liver enzymes within normal limits.  CBC notable for white blood cell count 13,600 with 86% neutrophils, hemoglobin 13.3, platelet count 232.  INR 1.1.  Type and screen ordered.  Per my interpretation, EKG in ED demonstrated the following: Sinus tachycardia with heart rate 113, prolonged QTc of 512, and no evidence of T wave or ST changes, including no evidence of ST elevation.  Imaging and additional notable ED work-up: Chest x-ray shows no evidence of acute cardiopulmonary process, including no evidence of infiltrate, edema, effusion, or pneumothorax.  Plain films of the right hip, performed radiology read, shows nondisplaced intertrochanteric fracture of the right femur.  EDP discussed the patient's case and imaging with the on-call orthopedic surgeon, Dr. Lorin Mercy, who recommended admission to the hospitalist service for further evaluation and management of acute right  hip fracture.  Dr. Lorin Mercy to formally consult, will evaluate the patient in the morning (3/10), and anticipates taking mom the patient to the OR for definitive surgical correction of presenting right hip fracture, with  associated request to keep the patient n.p.o.  While in the ED, the following were administered: Fentanyl 50 mcg IV x 1.  Subsequently, the patient was admitted for further evaluation management of presenting  acute right intertrochanteric hip fracture after presenting with ground-level mechanical fall, with presenting labs notable for mild leukocytosis.     Review of Systems: As per HPI otherwise 10 point review of systems negative.   Past Medical History:  Diagnosis Date   Allergy    Arthritis    Cataract    Dementia ([place])    Diabetes mellitus without complication ([place])    Hypertension     Past Surgical History:  Procedure Laterality Date   ABDOMINAL HYSTERECTOMY     EYE SURGERY     THYROID SURGERY     THYROID SURGERY  approx. 30 years ago    Social History:  reports that she has never smoked. She has never used smokeless tobacco. She reports that she does not drink alcohol and does not use drugs.   Allergies  Allergen Reactions   Aspirin Hives and Swelling   Metformin Other (See Comments)    GI upset   Pork-Derived Products Nausea And Vomiting   Pravastatin Other (See Comments)   Rosuvastatin Other (See Comments)    leg cramps   Simvastatin Other (See Comments)    GI upset    Family History  Problem Relation Age of Onset   Cancer Mother    Hypertension Father    Breast cancer Neg Hx     Family history reviewed and not pertinent    Prior to Admission medications   Medication Sig Start Date End Date Taking? Authorizing Provider  amitriptyline (ELAVIL) 50 MG tablet Take 50 mg by mouth at bedtime.   Yes [provider]  amLODipine (NORVASC) 5 MG tablet Take 5 mg by mouth daily. 06/07/22  Yes [provider]  donepezil (ARICEPT) 5 MG tablet Take 5 mg by mouth at bedtime. 06/28/22 06/28/23 Yes [provider]  ezetimibe (ZETIA) 10 MG tablet Take 10 mg by mouth daily. 12/01/21 12/01/22 Yes [provider]  fexofenadine (ALLEGRA) 180 MG tablet Take 180 mg by mouth daily as needed for allergies.   Yes [provider]  fluticasone (FLONASE) 50 MCG/ACT nasal spray Place 2 sprays into both nostrils daily as needed for allergies.  07/02/18  Yes [provider]  glipiZIDE (GLUCOTROL XL) 5 MG 24 hr tablet Take 5 mg by mouth daily with breakfast. 06/08/22  Yes [provider]  lisinopril (ZESTRIL) 40 MG tablet Take 40 mg by mouth daily.   Yes [provider]  omeprazole (PRILOSEC OTC) 20 MG tablet Take 20 mg by mouth daily as needed (reflux).   Yes [provider]  potassium chloride (K-DUR) 10 MEQ tablet Take 1 tablet (10 mEq total) by mouth daily. 02/09/16  Yes Sudini, Alveta Heimlich, MD  ondansetron (ZOFRAN) 8 MG tablet Take 1 tablet (8 mg total) by mouth every 8 (eight) hours as needed for nausea or vomiting. Patient not taking: Reported on 08/28/2022 04/28/15   Orma Flaming, MD     Objective    Physical Exam: Vitals:   08/27/22 2151 08/28/22 0127  BP: (!) 146/83 (!) 161/80  Pulse: 99 (!) 105  Resp: 16 19  Temp: 98.2 F (36.8 C) 97.8 F (36.6 C)  TempSrc: Oral Oral  SpO2: 96% 98%  Weight: 74.8 kg   Height: '5\' 5"'$  (1.651 m)     General: appears to be  stated age; alert, oriented Skin: warm, dry, no rash Head:  AT/[place] Mouth:  Oral mucosa membranes appear moist, normal dentition Neck: supple; trachea midline Heart:  RRR; did not appreciate any M/R/G Lungs: CTAB, did not appreciate any wheezes, rales, or rhonchi Abdomen: + BS; soft, ND, NT Vascular: 2+ pedal pulses b/l; 2+ radial pulses b/l Extremities: Right lower extremity appears externally rotated, and slightly shorter than its left counterpart; no peripheral edema, no muscle wasting Neuro: sensation intact in upper and lower extremities b/l; deferred assessment of strength involving the right lower extremity in the context of presenting acute right hip fracture.    Labs on Admission: I have personally reviewed following labs and imaging studies  CBC: Recent Labs  Lab 08/27/22 2316  WBC 13.6*  NEUTROABS 11.8*  HGB 13.3  HCT 41.2  MCV 90.5  PLT [id number]   Basic Metabolic Panel: Recent Labs  Lab 08/27/22 2316  NA 133*   K 3.5  CL 98  CO2 23  GLUCOSE 196*  BUN 11  CREATININE 0.82  CALCIUM 8.6*   GFR: Estimated Creatinine Clearance: 54.4 mL/min (by C-G formula based on SCr of 0.82 mg/dL). Liver Function Tests: Recent Labs  Lab 08/27/22 2316  AST 31  ALT 22  ALKPHOS 65  BILITOT 0.6  PROT 7.0  ALBUMIN 3.8   No results for input(s): "LIPASE", "AMYLASE" in the last 168 hours. No results for input(s): "AMMONIA" in the last 168 hours. Coagulation Profile: Recent Labs  Lab 08/27/22 2316  INR 1.1   Cardiac Enzymes: No results for input(s): "CKTOTAL", "CKMB", "CKMBINDEX", "TROPONINI" in the last 168 hours. BNP (last 3 results) No results for input(s): "PROBNP" in the last 8760 hours. HbA1C: No results for input(s): "HGBA1C" in the last 72 hours. CBG: No results for input(s): "GLUCAP" in the last 168 hours. Lipid Profile: No results for input(s): "CHOL", "HDL", "LDLCALC", "TRIG", "CHOLHDL", "LDLDIRECT" in the last 72 hours. Thyroid Function Tests: No results for input(s): "TSH", "T4TOTAL", "FREET4", "T3FREE", "THYROIDAB" in the last 72 hours. Anemia Panel: No results for input(s): "VITAMINB12", "FOLATE", "FERRITIN", "TIBC", "IRON", "RETICCTPCT" in the last 72 hours. Urine analysis:    Component Value Date/Time   COLORURINE STRAW (A) 02/08/2016 1529   APPEARANCEUR CLEAR (A) 02/08/2016 1529   LABSPEC 1.002 (L) 02/08/2016 1529   PHURINE 6.0 02/08/2016 1529   GLUCOSEU NEGATIVE 02/08/2016 1529   HGBUR 1+ (A) 02/08/2016 1529   BILIRUBINUR NEGATIVE 02/08/2016 1529   BILIRUBINUR negative 04/28/2015 1200   KETONESUR NEGATIVE 02/08/2016 1529   PROTEINUR NEGATIVE 02/08/2016 1529   UROBILINOGEN 0.2 04/28/2015 1200   UROBILINOGEN 0.2 02/17/2014 0829   NITRITE NEGATIVE 02/08/2016 1529   LEUKOCYTESUR NEGATIVE 02/08/2016 1529    Radiological Exams on Admission: DG Chest 1 View  Result Date: 08/28/2022 CLINICAL DATA:  Hip fracture.  Preop EXAM: CHEST  1 VIEW COMPARISON:  Report from radiograph  Nov 13, 1996 FINDINGS: Normal cardiomediastinal silhouette. No focal consolidation, pleural effusion, or pneumothorax. No acute osseous abnormality. IMPRESSION: No active disease. Electronically Signed   By: Placido Sou M.D.   On: 08/28/2022 00:10   DG Hip Unilat  With Pelvis 2-3 Views Right  Result Date: 08/27/2022 CLINICAL DATA:  Fall and right hip pain. EXAM: DG HIP (WITH OR WITHOUT PELVIS) 2-3V RIGHT COMPARISON:  CT of the abdomen pelvis 02/08/2016. FINDINGS: Evaluation is limited due to osteopenia and body habitus. There is a nondisplaced intertrochanteric fracture of the right femur. No other acute fracture. The bones are osteopenic. Mild bilateral hip  arthritic changes. The soft tissues are unremarkable. IMPRESSION: Nondisplaced intertrochanteric fracture of the right femur. Electronically Signed   By: Anner Crete M.D.   On: 08/27/2022 22:24      Assessment/Plan    Principal Problem:   Closed right hip fracture [facility]) Active Problems:   Fall at home, initial encounter   Leukocytosis   Prolonged QT interval   Essential hypertension   DM2 (diabetes mellitus, type 2) ([place])     #) Acute right intertrochanteric hip fracture: confirmed via presenting plain films and stemming from ground level mechanical fall without associated loss of consciousness that occurred earlier on the day of admission, as further described above, resulting in immediate develop of acute right hip pain.  At this time, the right lower extremity appears neurovascularly intact, and patient reports adequate pain control. Not on any blood thinners at home, including no aspirin.   The patient's case/imaging were discussed with the on-call orthopedic surgeon, Dr.  Lorin Mercy,  who recommended admission to the hospitalist service for further evaluation/management of acute right hip fx, including preoperative medical optimization, and plan to take pt to the OR tomorrow (3/10) for definitive surgical management. Consistent  with this, orthopedic surgery requests that pt be made NPO at MN.   Gupta Score for this patient in the context of anticipated aforementioned orthopedic surgery conveys a  0.41% perioperative risk for significant cardiac event. No evidence to suggest acutely decompensated heart failure or acute MI. Consequently, no absolute contraindications to proceeding with proposed orthopedic surgery at this time.  Of note, presenting INR found to be 1.1.   Plan: Formal orthopedic surgery consult for definitive surgical management, with plan to take patient to the OR tomorrow (3/10). NPO after MN in anticipation of this procedure.  No pharmacologic anticoagulation leading up to this anticipated surgery. SCD's. Prn IV fentanyl. Anticipate postoperative PT consult. Check 25-hydroxy vit D level to assess for any underlying pathological contribution towards the patient's fracture stemming from associated vitamin deficiency. Type and screen ordered.           #) Ground level mechanical fall: The patient reports a ground level mechanical fall earlier today in which she tripped without any associated loss of consciousness. Appears to be purely mechanical in nature, without clinical evidence at this time to suggest contributory dizziness, presyncope, syncope, or acute ischemic CVA. Does not appear to have hit head as component of this fall. presentation does not appear to be associated any acute neurologic deficits.  While this fall appears to be purely mechanical in nature, will also check urinalysis to evaluate for any underlying infectious contribution.  Of note, presenting chest x-ray showed no evidence of acute cardiopulmonary process, including no evidence of infiltrate   Plan: Check urinalysis, as above.  CMP and CBC with differential in the morning. Fall precautions. Anticipate postoperative PT consult.            #) Leukocytosis: Presenting CBC reflects mildly elevated white cell count of 13,600.  Suspect that this is reactive in nature in the setting of presenting ground-level mechanical fall as well as associated acute hip fracture.  While presentation is also noted to include mild tachycardia, no evidence to suggest underlying infectious process at this time.therefore, criteria not met for sepsis at this time.  Of note, chest x-ray showed no evidence of acute cardiopulmonary process, including no evidence of infiltrate.  Also check urinalysis.   Plan: Check urinalysis, as above.  Repeat CBC with diff in the morning.  Monitor strict  I's and O's and daily weights.               #) QTc prolongation: Presenting EKG demonstrates QTc of 512 ms. outpatient medications that may be contributing to QTc prolongation: Amitriptyline.    Plan: Monitor on telemetry.  Add-on serum magnesium level.  Repeat EKG in the morning to monitor interval degree of QTc prolongation.  Hold outpatient amitriptyline.              #) Type 2 Diabetes Mellitus: documented history of such. Home insulin regimen: None. Home oral hypoglycemic agents: Glipizide. presenting blood sugar: 196.  Per my chart review, have not encountered a prior hemoglobin A1c level.    Plan: In the setting of current n.p.o. status, will pursue Accu-Cheks every 6 hours with very low sliding scale insulin.  Add on hemoglobin A1c level.  hold home oral hypoglycemic agents during this hospitalization.               #) Essential Hypertension: documented h/o such, with outpatient antihypertensive regimen including lisinopril, amlodipine.  SBP's in the ED today: 140s to 160s mmHg.   Plan: Close monitoring of subsequent BP via routine VS. in the setting of current n.p.o. status, will hold home antihypertensive medications for now.       DVT prophylaxis: SCD's   Code Status: Full code Family Communication: none Disposition Plan: Per Rounding Team Consults called: EDP discussed patient's case/imaging with the  on-call orthopedic surgeon, Dr. Lorin Mercy, Who will formally consult and see the patient in the morning, as further detailed above;  Admission status: Inpatient    I SPENT GREATER THAN 75  MINUTES IN CLINICAL CARE TIME/MEDICAL DECISION-MAKING IN COMPLETING THIS ADMISSION.     Woodside East DO [facility] [place]   08/28/2022, 3:16 AM

## 2022-08-28 NOTE — Consult Note (Signed)
Reason for Consult: Right nondisplaced intertrochanteric hip fracture Referring Physician: Estill Cotta MD  Lauren Frederick is an 82 y.o. female.  HPI: 82 year old female type 2 diabetes hypertension and dementia had a fall and presented to the ER from home.  Patient was in the parking lot at a furniture store and fell at ground-level.  She is unable to ambulate and had immediate sharp right hip pain.  X-rays demonstrated nondisplaced intertrochanteric hip fracture.  No past history of hip problems.  No loss of consciousness reported.  Past Medical History:  Diagnosis Date   Allergy    Arthritis    Cataract    Dementia ([place])    Diabetes mellitus without complication ([place])    Hypertension     Past Surgical History:  Procedure Laterality Date   ABDOMINAL HYSTERECTOMY     EYE SURGERY     THYROID SURGERY     THYROID SURGERY  approx. 34 years ago    Family History  Problem Relation Age of Onset   Cancer Mother    Hypertension Father    Breast cancer Neg Hx     Social History:  reports that she has never smoked. She has never used smokeless tobacco. She reports that she does not drink alcohol and does not use drugs.  Allergies:  Allergies  Allergen Reactions   Aspirin Hives and Swelling   Metformin Other (See Comments)    GI upset   Pork-Derived Products Nausea And Vomiting   Pravastatin Other (See Comments)   Rosuvastatin Other (See Comments)    leg cramps   Simvastatin Other (See Comments)    GI upset    Medications: I have reviewed the patient's current medications.  Results for orders placed or performed during the hospital encounter of 08/27/22 (from the past 48 hour(s))  CBC with Differential     Status: Abnormal   Collection Time: 08/27/22 11:16 PM  Result Value Ref Range   WBC 13.6 (H) 4.0 - 10.5 K/uL   RBC 4.55 3.87 - 5.11 MIL/uL   Hemoglobin 13.3 12.0 - 15.0 g/dL   HCT 41.2 36.0 - 46.0 %   MCV 90.5 80.0 - 100.0 fL   MCH 29.2 26.0 - 34.0 pg   MCHC 32.3  30.0 - 36.0 g/dL   RDW 13.2 11.5 - 15.5 %   Platelets 232 150 - 400 K/uL   nRBC 0.0 0.0 - 0.2 %   Neutrophils Relative % 86 %   Neutro Abs 11.8 (H) 1.7 - 7.7 K/uL   Lymphocytes Relative 7 %   Lymphs Abs 0.9 0.7 - 4.0 K/uL   Monocytes Relative 6 %   Monocytes Absolute 0.8 0.1 - 1.0 K/uL   Eosinophils Relative 0 %   Eosinophils Absolute 0.0 0.0 - 0.5 K/uL   Basophils Relative 0 %   Basophils Absolute 0.0 0.0 - 0.1 K/uL   Immature Granulocytes 1 %   Abs Immature Granulocytes 0.09 (H) 0.00 - 0.07 K/uL    Comment: Performed at [facility], [place] [address] [place], [place] [address]  Protime-INR     Status: None   Collection Time: 08/27/22 11:16 PM  Result Value Ref Range   Prothrombin Time 14.5 11.4 - 15.2 seconds   INR 1.1 0.8 - 1.2    Comment: (NOTE) INR goal varies based on device and disease states. Performed at [facility], [place] [address], [place], [place] [address]   Type and screen     Status: None  Collection Time: 08/27/22 11:16 PM  Result Value Ref Range   ABO/RH(D) B POS    Antibody Screen NEG    Sample Expiration      08/30/2022,2359 Performed at [facility], [place] [address] [place], [place] [address]   Comprehensive metabolic panel     Status: Abnormal   Collection Time: 08/27/22 11:16 PM  Result Value Ref Range   Sodium 133 (L) 135 - 145 mmol/L   Potassium 3.5 3.5 - 5.1 mmol/L   Chloride 98 98 - 111 mmol/L   CO2 23 22 - 32 mmol/L   Glucose, Bld 196 (H) 70 - 99 mg/dL    Comment: Glucose reference range applies only to samples taken after fasting for at least 8 hours.   BUN 11 8 - 23 mg/dL   Creatinine, Ser 0.82 0.44 - 1.00 mg/dL   Calcium 8.6 (L) 8.9 - 10.3 mg/dL   Total Protein 7.0 6.5 - 8.1 g/dL   Albumin 3.8 3.5 - 5.0 g/dL   AST 31 15 - 41 U/L   ALT 22 0 - 44 U/L   Alkaline Phosphatase 65 38 - 126 U/L   Total Bilirubin 0.6 0.3 - 1.2 mg/dL   GFR, Estimated >60 >60 mL/min    Comment:  (NOTE) Calculated using the CKD-EPI Creatinine Equation (2021)    Anion gap 12 5 - 15    Comment: Performed at [facility], [place] [address], [place], [place] [address]  Comprehensive metabolic panel     Status: Abnormal   Collection Time: 08/28/22  1:33 AM  Result Value Ref Range   Sodium 133 (L) 135 - 145 mmol/L   Potassium 3.4 (L) 3.5 - 5.1 mmol/L   Chloride 97 (L) 98 - 111 mmol/L   CO2 26 22 - 32 mmol/L   Glucose, Bld 209 (H) 70 - 99 mg/dL    Comment: Glucose reference range applies only to samples taken after fasting for at least 8 hours.   BUN 11 8 - 23 mg/dL   Creatinine, Ser 0.78 0.44 - 1.00 mg/dL   Calcium 8.6 (L) 8.9 - 10.3 mg/dL   Total Protein 7.1 6.5 - 8.1 g/dL   Albumin 3.9 3.5 - 5.0 g/dL   AST 28 15 - 41 U/L   ALT 23 0 - 44 U/L   Alkaline Phosphatase 69 38 - 126 U/L   Total Bilirubin 0.4 0.3 - 1.2 mg/dL   GFR, Estimated >60 >60 mL/min    Comment: (NOTE) Calculated using the CKD-EPI Creatinine Equation (2021)    Anion gap 10 5 - 15    Comment: Performed at [facility], [place] [address], [place], [place] [address]  Magnesium     Status: None   Collection Time: 08/28/22  1:33 AM  Result Value Ref Range   Magnesium 2.4 1.7 - 2.4 mg/dL    Comment: Performed at [facility], [place] [address], [place], [place] [address]  Urinalysis, Complete w Microscopic -Urine, Clean Catch     Status: Abnormal   Collection Time: 08/28/22  4:11 AM  Result Value Ref Range   Color, Urine YELLOW YELLOW   APPearance CLEAR CLEAR   Specific Gravity, Urine 1.005 1.005 - 1.030   pH 7.0 5.0 - 8.0   Glucose, UA NEGATIVE NEGATIVE mg/dL   Hgb urine dipstick NEGATIVE NEGATIVE   Bilirubin Urine NEGATIVE NEGATIVE   Ketones, ur NEGATIVE NEGATIVE mg/dL   Protein, ur NEGATIVE NEGATIVE mg/dL   Nitrite NEGATIVE  NEGATIVE   Leukocytes,Ua NEGATIVE NEGATIVE   RBC / HPF 0-5 0 - 5 RBC/hpf   WBC, UA 0-5 0 - 5 WBC/hpf   Bacteria, UA RARE (A) NONE  SEEN   Squamous Epithelial / HPF 0-5 0 - 5 /HPF    Comment: Performed at [facility], [place] [address], [place], [place] [address]  CBC with Differential/Platelet     Status: Abnormal   Collection Time: 08/28/22  4:12 AM  Result Value Ref Range   WBC 11.4 (H) 4.0 - 10.5 K/uL   RBC 4.18 3.87 - 5.11 MIL/uL   Hemoglobin 12.4 12.0 - 15.0 g/dL   HCT 38.2 36.0 - 46.0 %   MCV 91.4 80.0 - 100.0 fL   MCH 29.7 26.0 - 34.0 pg   MCHC 32.5 30.0 - 36.0 g/dL   RDW 13.2 11.5 - 15.5 %   Platelets 214 150 - 400 K/uL   nRBC 0.0 0.0 - 0.2 %   Neutrophils Relative % 79 %   Neutro Abs 9.0 (H) 1.7 - 7.7 K/uL   Lymphocytes Relative 13 %   Lymphs Abs 1.5 0.7 - 4.0 K/uL   Monocytes Relative 8 %   Monocytes Absolute 0.9 0.1 - 1.0 K/uL   Eosinophils Relative 0 %   Eosinophils Absolute 0.0 0.0 - 0.5 K/uL   Basophils Relative 0 %   Basophils Absolute 0.0 0.0 - 0.1 K/uL   Immature Granulocytes 0 %   Abs Immature Granulocytes 0.05 0.00 - 0.07 K/uL    Comment: Performed at [facility], [place] [address], [place], [place] [address]  Glucose, capillary     Status: Abnormal   Collection Time: 08/28/22  6:21 AM  Result Value Ref Range   Glucose-Capillary 168 (H) 70 - 99 mg/dL    Comment: Glucose reference range applies only to samples taken after fasting for at least 8 hours.    DG Chest 1 View  Result Date: 08/28/2022 CLINICAL DATA:  Hip fracture.  Preop EXAM: CHEST  1 VIEW COMPARISON:  Report from radiograph Nov 13, 1996 FINDINGS: Normal cardiomediastinal silhouette. No focal consolidation, pleural effusion, or pneumothorax. No acute osseous abnormality. IMPRESSION: No active disease. Electronically Signed   By: Placido Sou M.D.   On: 08/28/2022 00:10   DG Hip Unilat  With Pelvis 2-3 Views Right  Result Date: 08/27/2022 CLINICAL DATA:  Fall and right hip pain. EXAM: DG HIP (WITH OR WITHOUT PELVIS) 2-3V RIGHT COMPARISON:  CT of the abdomen pelvis 02/08/2016. FINDINGS:  Evaluation is limited due to osteopenia and body habitus. There is a nondisplaced intertrochanteric fracture of the right femur. No other acute fracture. The bones are osteopenic. Mild bilateral hip arthritic changes. The soft tissues are unremarkable. IMPRESSION: Nondisplaced intertrochanteric fracture of the right femur. Electronically Signed   By: Anner Crete M.D.   On: 08/27/2022 22:24    ROS patient is a poor historian with dementia.  No history of coronary disease heart failure stroke.  No history of peripheral edema.  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  She ambulates without any walking aids. Blood pressure (!) 143/80, pulse (!) 103, temperature 98.4 F (36.9 C), temperature source Oral, resp. rate 15, height '5\' 5"'$  (1.651 m), weight 74.7 kg, SpO2 95 %. Physical Exam Constitutional:      Comments: Pleasant conversant confused poor medical historian.  HENT:     Head: Normocephalic.  Nose: Nose normal.  Eyes:     Extraocular Movements: Extraocular movements intact.  Cardiovascular:     Rate and Rhythm: Normal rate.     Pulses: Normal pulses.  Pulmonary:     Effort: Pulmonary effort is normal.  Abdominal:     Palpations: Abdomen is soft.  Musculoskeletal:        General: Tenderness present. No deformity.     Cervical back: Normal range of motion.  Skin:    General: Skin is warm and dry.  Neurological:     General: No focal deficit present.  Psychiatric:        Mood and Affect: Mood normal.     Comments: Poor historian.  Dementia.  Pleasant and conversant.     Assessment/Plan: 82 year old female with intertrochanteric hip fracture right hip.  Plan stabilization with intramedullary hip screw with distal interlock.  Procedure discussed.  Questions were elicited and answered.  Patient understands request to proceed.  Discussed plan procedure with daughter.  She mentions she like to talk with the  hospitalist about possibly getting scan done of her head since patient has progressive memory loss.  Previous CT scan 2007 2009 after a fall and patient did hit her eye was negative for acute changes.  She certainly could have a CT scan done postoperatively, if the hospitalist feels that it is indicated.  Patient's daughter can talk with hospitalist about this.  Marybelle Killings 08/28/2022, 9:26 AM

## 2022-08-28 NOTE — H&P (View-Only) (Signed)
Reason for Consult: Right nondisplaced intertrochanteric hip fracture Referring Physician: Estill Cotta MD  Lauren Frederick is an 82 y.o. female.  HPI: 82 year old female type 2 diabetes hypertension and dementia had a fall and presented to the ER from home.  Patient was in the parking lot at a furniture store and fell at ground-level.  She is unable to ambulate and had immediate sharp right hip pain.  X-rays demonstrated nondisplaced intertrochanteric hip fracture.  No past history of hip problems.  No loss of consciousness reported.  Past Medical History:  Diagnosis Date   Allergy    Arthritis    Cataract    Dementia ([place])    Diabetes mellitus without complication ([place])    Hypertension     Past Surgical History:  Procedure Laterality Date   ABDOMINAL HYSTERECTOMY     EYE SURGERY     THYROID SURGERY     THYROID SURGERY  approx. 74 years ago    Family History  Problem Relation Age of Onset   Cancer Mother    Hypertension Father    Breast cancer Neg Hx     Social History:  reports that she has never smoked. She has never used smokeless tobacco. She reports that she does not drink alcohol and does not use drugs.  Allergies:  Allergies  Allergen Reactions   Aspirin Hives and Swelling   Metformin Other (See Comments)    GI upset   Pork-Derived Products Nausea And Vomiting   Pravastatin Other (See Comments)   Rosuvastatin Other (See Comments)    leg cramps   Simvastatin Other (See Comments)    GI upset    Medications: I have reviewed the patient's current medications.  Results for orders placed or performed during the hospital encounter of 08/27/22 (from the past 48 hour(s))  CBC with Differential     Status: Abnormal   Collection Time: 08/27/22 11:16 PM  Result Value Ref Range   WBC 13.6 (H) 4.0 - 10.5 K/uL   RBC 4.55 3.87 - 5.11 MIL/uL   Hemoglobin 13.3 12.0 - 15.0 g/dL   HCT 41.2 36.0 - 46.0 %   MCV 90.5 80.0 - 100.0 fL   MCH 29.2 26.0 - 34.0 pg   MCHC 32.3  30.0 - 36.0 g/dL   RDW 13.2 11.5 - 15.5 %   Platelets 232 150 - 400 K/uL   nRBC 0.0 0.0 - 0.2 %   Neutrophils Relative % 86 %   Neutro Abs 11.8 (H) 1.7 - 7.7 K/uL   Lymphocytes Relative 7 %   Lymphs Abs 0.9 0.7 - 4.0 K/uL   Monocytes Relative 6 %   Monocytes Absolute 0.8 0.1 - 1.0 K/uL   Eosinophils Relative 0 %   Eosinophils Absolute 0.0 0.0 - 0.5 K/uL   Basophils Relative 0 %   Basophils Absolute 0.0 0.0 - 0.1 K/uL   Immature Granulocytes 1 %   Abs Immature Granulocytes 0.09 (H) 0.00 - 0.07 K/uL    Comment: Performed at [facility], [place] [address], [place], [place] [address]  Protime-INR     Status: None   Collection Time: 08/27/22 11:16 PM  Result Value Ref Range   Prothrombin Time 14.5 11.4 - 15.2 seconds   INR 1.1 0.8 - 1.2    Comment: (NOTE) INR goal varies based on device and disease states. Performed at [facility], [place] [address], [place],  [address]   Type and screen     Status: None  Collection Time: 08/27/22 11:16 PM  Result Value Ref Range   ABO/RH(D) B POS    Antibody Screen NEG    Sample Expiration      08/30/2022,2359 Performed at [facility], [place] [address], [place], [place] [address]   Comprehensive metabolic panel     Status: Abnormal   Collection Time: 08/27/22 11:16 PM  Result Value Ref Range   Sodium 133 (L) 135 - 145 mmol/L   Potassium 3.5 3.5 - 5.1 mmol/L   Chloride 98 98 - 111 mmol/L   CO2 23 22 - 32 mmol/L   Glucose, Bld 196 (H) 70 - 99 mg/dL    Comment: Glucose reference range applies only to samples taken after fasting for at least 8 hours.   BUN 11 8 - 23 mg/dL   Creatinine, Ser 0.82 0.44 - 1.00 mg/dL   Calcium 8.6 (L) 8.9 - 10.3 mg/dL   Total Protein 7.0 6.5 - 8.1 g/dL   Albumin 3.8 3.5 - 5.0 g/dL   AST 31 15 - 41 U/L   ALT 22 0 - 44 U/L   Alkaline Phosphatase 65 38 - 126 U/L   Total Bilirubin 0.6 0.3 - 1.2 mg/dL   GFR, Estimated >60 >60 mL/min    Comment:  (NOTE) Calculated using the CKD-EPI Creatinine Equation (2021)    Anion gap 12 5 - 15    Comment: Performed at [facility], [place] [address] [place], [place] [address]  Comprehensive metabolic panel     Status: Abnormal   Collection Time: 08/28/22  1:33 AM  Result Value Ref Range   Sodium 133 (L) 135 - 145 mmol/L   Potassium 3.4 (L) 3.5 - 5.1 mmol/L   Chloride 97 (L) 98 - 111 mmol/L   CO2 26 22 - 32 mmol/L   Glucose, Bld 209 (H) 70 - 99 mg/dL    Comment: Glucose reference range applies only to samples taken after fasting for at least 8 hours.   BUN 11 8 - 23 mg/dL   Creatinine, Ser 0.78 0.44 - 1.00 mg/dL   Calcium 8.6 (L) 8.9 - 10.3 mg/dL   Total Protein 7.1 6.5 - 8.1 g/dL   Albumin 3.9 3.5 - 5.0 g/dL   AST 28 15 - 41 U/L   ALT 23 0 - 44 U/L   Alkaline Phosphatase 69 38 - 126 U/L   Total Bilirubin 0.4 0.3 - 1.2 mg/dL   GFR, Estimated >60 >60 mL/min    Comment: (NOTE) Calculated using the CKD-EPI Creatinine Equation (2021)    Anion gap 10 5 - 15    Comment: Performed at [facility], [place] [address], [place], [place] [address]  Magnesium     Status: None   Collection Time: 08/28/22  1:33 AM  Result Value Ref Range   Magnesium 2.4 1.7 - 2.4 mg/dL    Comment: Performed at [facility], [place] [address] [place],  [address]  Urinalysis, Complete w Microscopic -Urine, Clean Catch     Status: Abnormal   Collection Time: 08/28/22  4:11 AM  Result Value Ref Range   Color, Urine YELLOW YELLOW   APPearance CLEAR CLEAR   Specific Gravity, Urine 1.005 1.005 - 1.030   pH 7.0 5.0 - 8.0   Glucose, UA NEGATIVE NEGATIVE mg/dL   Hgb urine dipstick NEGATIVE NEGATIVE   Bilirubin Urine NEGATIVE NEGATIVE   Ketones, ur NEGATIVE NEGATIVE mg/dL   Protein, ur NEGATIVE NEGATIVE mg/dL   Nitrite NEGATIVE  NEGATIVE   Leukocytes,Ua NEGATIVE NEGATIVE   RBC / HPF 0-5 0 - 5 RBC/hpf   WBC, UA 0-5 0 - 5 WBC/hpf   Bacteria, UA RARE (A) NONE  SEEN   Squamous Epithelial / HPF 0-5 0 - 5 /HPF    Comment: Performed at [facility], [place] [address] [place], [place] [address]  CBC with Differential/Platelet     Status: Abnormal   Collection Time: 08/28/22  4:12 AM  Result Value Ref Range   WBC 11.4 (H) 4.0 - 10.5 K/uL   RBC 4.18 3.87 - 5.11 MIL/uL   Hemoglobin 12.4 12.0 - 15.0 g/dL   HCT 38.2 36.0 - 46.0 %   MCV 91.4 80.0 - 100.0 fL   MCH 29.7 26.0 - 34.0 pg   MCHC 32.5 30.0 - 36.0 g/dL   RDW 13.2 11.5 - 15.5 %   Platelets 214 150 - 400 K/uL   nRBC 0.0 0.0 - 0.2 %   Neutrophils Relative % 79 %   Neutro Abs 9.0 (H) 1.7 - 7.7 K/uL   Lymphocytes Relative 13 %   Lymphs Abs 1.5 0.7 - 4.0 K/uL   Monocytes Relative 8 %   Monocytes Absolute 0.9 0.1 - 1.0 K/uL   Eosinophils Relative 0 %   Eosinophils Absolute 0.0 0.0 - 0.5 K/uL   Basophils Relative 0 %   Basophils Absolute 0.0 0.0 - 0.1 K/uL   Immature Granulocytes 0 %   Abs Immature Granulocytes 0.05 0.00 - 0.07 K/uL    Comment: Performed at [facility], [place] [address], [place], [place] [address]  Glucose, capillary     Status: Abnormal   Collection Time: 08/28/22  6:21 AM  Result Value Ref Range   Glucose-Capillary 168 (H) 70 - 99 mg/dL    Comment: Glucose reference range applies only to samples taken after fasting for at least 8 hours.    DG Chest 1 View  Result Date: 08/28/2022 CLINICAL DATA:  Hip fracture.  Preop EXAM: CHEST  1 VIEW COMPARISON:  Report from radiograph Nov 13, 1996 FINDINGS: Normal cardiomediastinal silhouette. No focal consolidation, pleural effusion, or pneumothorax. No acute osseous abnormality. IMPRESSION: No active disease. Electronically Signed   By: Placido Sou M.D.   On: 08/28/2022 00:10   DG Hip Unilat  With Pelvis 2-3 Views Right  Result Date: 08/27/2022 CLINICAL DATA:  Fall and right hip pain. EXAM: DG HIP (WITH OR WITHOUT PELVIS) 2-3V RIGHT COMPARISON:  CT of the abdomen pelvis 02/08/2016. FINDINGS:  Evaluation is limited due to osteopenia and body habitus. There is a nondisplaced intertrochanteric fracture of the right femur. No other acute fracture. The bones are osteopenic. Mild bilateral hip arthritic changes. The soft tissues are unremarkable. IMPRESSION: Nondisplaced intertrochanteric fracture of the right femur. Electronically Signed   By: Anner Crete M.D.   On: 08/27/2022 22:24    ROS patient is a poor historian with dementia.  No history of coronary disease heart failure stroke.  No history of peripheral edema.  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  She ambulates without any walking aids. Blood pressure (!) 143/80, pulse (!) 103, temperature 98.4 F (36.9 C), temperature source Oral, resp. rate 15, height '5\' 5"'$  (1.651 m), weight 74.7 kg, SpO2 95 %. Physical Exam Constitutional:      Comments: Pleasant conversant confused poor medical historian.  HENT:     Head: Normocephalic.  Nose: Nose normal.  Eyes:     Extraocular Movements: Extraocular movements intact.  Cardiovascular:     Rate and Rhythm: Normal rate.     Pulses: Normal pulses.  Pulmonary:     Effort: Pulmonary effort is normal.  Abdominal:     Palpations: Abdomen is soft.  Musculoskeletal:        General: Tenderness present. No deformity.     Cervical back: Normal range of motion.  Skin:    General: Skin is warm and dry.  Neurological:     General: No focal deficit present.  Psychiatric:        Mood and Affect: Mood normal.     Comments: Poor historian.  Dementia.  Pleasant and conversant.     Assessment/Plan: 82 year old female with intertrochanteric hip fracture right hip.  Plan stabilization with intramedullary hip screw with distal interlock.  Procedure discussed.  Questions were elicited and answered.  Patient understands request to proceed.  Discussed plan procedure with daughter.  She mentions she like to talk with the  hospitalist about possibly getting scan done of her head since patient has progressive memory loss.  Previous CT scan 2007 2009 after a fall and patient did hit her eye was negative for acute changes.  She certainly could have a CT scan done postoperatively, if the hospitalist feels that it is indicated.  Patient's daughter can talk with hospitalist about this.  Marybelle Killings 08/28/2022, 9:26 AM

## 2022-08-28 NOTE — Transfer of Care (Signed)
Immediate Anesthesia Transfer of Care Note  Patient: Lauren Frederick  Procedure(s) Performed: Procedure(s) with comments: INTRAMEDULLARY (IM) NAIL INTERTROCHANTERIC (Right) - hana table, Biomet affixus nail  Patient Location: PACU  Anesthesia Type:General  Level of Consciousness:  sedated, patient cooperative and responds to stimulation  Airway & Oxygen Therapy:Patient Spontanous Breathing and Patient connected to face mask oxgen  Post-op Assessment:  Report given to PACU RN and Post -op Vital signs reviewed and stable  Post vital signs:  Reviewed and stable  Last Vitals:  Vitals:   08/28/22 1113 08/28/22 1115  BP: (!) 146/69 (!) 146/69  Pulse: 88 88  Resp: 20 20  Temp:    SpO2: 123XX123 [id number]    Complications: No apparent anesthesia complications

## 2022-08-29 DIAGNOSIS — S72001A Fracture of unspecified part of neck of right femur, initial encounter for closed fracture: Secondary | ICD-10-CM | POA: Diagnosis not present

## 2022-08-29 DIAGNOSIS — S72141A Displaced intertrochanteric fracture of right femur, initial encounter for closed fracture: Secondary | ICD-10-CM

## 2022-08-29 DIAGNOSIS — E119 Type 2 diabetes mellitus without complications: Secondary | ICD-10-CM | POA: Diagnosis not present

## 2022-08-29 DIAGNOSIS — I10 Essential (primary) hypertension: Secondary | ICD-10-CM

## 2022-08-29 DIAGNOSIS — S72144D Nondisplaced intertrochanteric fracture of right femur, subsequent encounter for closed fracture with routine healing: Secondary | ICD-10-CM

## 2022-08-29 LAB — CBC
HCT: 36 % (ref 36.0–46.0)
Hemoglobin: 11.4 g/dL — ABNORMAL LOW (ref 12.0–15.0)
MCH: 29.5 pg (ref 26.0–34.0)
MCHC: 31.7 g/dL (ref 30.0–36.0)
MCV: 93.3 fL (ref 80.0–100.0)
Platelets: 170 10*3/uL (ref 150–400)
RBC: 3.86 MIL/uL — ABNORMAL LOW (ref 3.87–5.11)
RDW: 13.4 % (ref 11.5–15.5)
WBC: 10.6 10*3/uL — ABNORMAL HIGH (ref 4.0–10.5)
nRBC: 0 % (ref 0.0–0.2)

## 2022-08-29 LAB — GLUCOSE, CAPILLARY
Glucose-Capillary: 171 mg/dL — ABNORMAL HIGH (ref 70–99)
Glucose-Capillary: 157 mg/dL — ABNORMAL HIGH (ref 70–99)
Glucose-Capillary: 169 mg/dL — ABNORMAL HIGH (ref 70–99)
Glucose-Capillary: 153 mg/dL — ABNORMAL HIGH (ref 70–99)

## 2022-08-29 LAB — BASIC METABOLIC PANEL
Anion gap: 8 (ref 5–15)
BUN: 11 mg/dL (ref 8–23)
CO2: 24 mmol/L (ref 22–32)
Calcium: 8.2 mg/dL — ABNORMAL LOW (ref 8.9–10.3)
Chloride: 99 mmol/L (ref 98–111)
Creatinine, Ser: 0.71 mg/dL (ref 0.44–1.00)
GFR, Estimated: >60 mL/min (ref >60)
Glucose, Bld: 247 mg/dL — ABNORMAL HIGH (ref 70–99)
Potassium: 3.7 mmol/L (ref 3.5–5.1)
Sodium: 131 mmol/L — ABNORMAL LOW (ref 135–145)

## 2022-08-29 LAB — HEMOGLOBIN A1C
Hgb A1c MFr Bld: 7.1 % — ABNORMAL HIGH (ref 4.8–5.6)
Mean Plasma Glucose: 157 mg/dL

## 2022-08-29 MED ORDER — INSULIN ASPART 100 UNIT/ML IJ SOLN
3.0000 [IU] | Freq: Three times a day (TID) | INTRAMUSCULAR | Status: DC
  Administered 2022-08-29 – 2022-08-31 (×6): 3 [IU] via SUBCUTANEOUS

## 2022-08-29 MED ORDER — POLYETHYLENE GLYCOL 3350 17 G PO PACK
17.0000 g | PACK | Freq: Every day | ORAL | Status: DC
  Administered 2022-08-29 – 2022-08-31 (×3): 17 g via ORAL
  Filled 2022-08-29 (×3): qty 1

## 2022-08-29 MED ORDER — OXYCODONE-ACETAMINOPHEN 5-325 MG PO TABS: 1.0000 | ORAL_TABLET | Freq: Four times a day (QID) | ORAL | 0 refills | Status: DC | PRN

## 2022-08-29 NOTE — NC FL2 (Signed)
[place] LEVEL OF CARE FORM     IDENTIFICATION  Patient Name: Lauren Frederick Birthdate: 07/08/1940 Sex: female Admission Date (Current Location): 08/27/2022  [facility] and [place] Number:  Herbalist and Address:  [facility],  [place], [place]      Provider Number: [id number]  Attending Physician Name and Address:  Mendel Corning, MD  Relative Name and Phone Number:  daughter, Keelie Lindseth [phone]    Current Level of Care: Hospital Recommended Level of Care: [place] Prior Approval Number:    Date Approved/Denied:   PASRR Number: [id number]  Discharge Plan: SNF    Current Diagnoses: Patient Active Problem List   Diagnosis Date Noted   Intertrochanteric fracture of right hip, closed, initial encounter ([place]) 08/28/2022   Fall at home, initial encounter 08/28/2022   Leukocytosis 08/28/2022   Prolonged QT interval 08/28/2022   Essential hypertension 08/28/2022   DM2 (diabetes mellitus, type 2) ([place]) 08/28/2022   Hypokalemia 02/08/2016    Orientation RESPIRATION BLADDER Height & Weight     Self, Place  Normal Continent, External catheter (currently with purewick) Weight: 167 lb 1.7 oz (75.8 kg) Height:  '5\' 5"'$  (165.1 cm)  BEHAVIORAL SYMPTOMS/MOOD NEUROLOGICAL BOWEL NUTRITION STATUS      Continent Diet (regular)  AMBULATORY STATUS COMMUNICATION OF NEEDS Skin   Extensive Assist Verbally Other (Comment) (surgical incision only)                       Personal Care Assistance Level of Assistance  Bathing, Dressing Bathing Assistance: Limited assistance   Dressing Assistance: Limited assistance     Functional Limitations Info  Hearing, Speech, Sight Sight Info: Adequate Hearing Info: Adequate Speech Info: Adequate    SPECIAL CARE FACTORS FREQUENCY  PT (By licensed PT), OT (By licensed OT)     PT Frequency: 5x/wk OT Frequency: 5x/wk            Contractures Contractures  Info: Not present    Additional Factors Info  Code Status, Insulin Sliding Scale Code Status Info: Full     Insulin Sliding Scale Info: see MAR       Current Medications (08/29/2022):  This is the current hospital active medication list Current Facility-Administered Medications  Medication Dose Route Frequency Provider Last Rate Last Admin   0.45 % sodium chloride infusion   Intravenous Continuous Marybelle Killings, MD 75 mL/hr at 08/29/22 1054 Infusion Verify at 08/29/22 1054   acetaminophen (TYLENOL) tablet 650 mg  650 mg Oral Q6H PRN Howerter, Justin B, DO   650 mg at 08/29/22 1207   Or   acetaminophen (TYLENOL) suppository 650 mg  650 mg Rectal Q6H PRN Howerter, Justin B, DO       amLODipine (NORVASC) tablet 5 mg  5 mg Oral Daily Rai, Ripudeep K, MD   5 mg at 08/29/22 0853   docusate sodium (COLACE) capsule 100 mg  100 mg Oral BID Marybelle Killings, MD   100 mg at 08/29/22 0853   enoxaparin (LOVENOX) injection 40 mg  40 mg Subcutaneous Q24H Marybelle Killings, MD   40 mg at 08/29/22 0853   HYDROmorphone (DILAUDID) injection 0.5 mg  0.5 mg Intravenous Q2H PRN Howerter, Justin B, DO   0.5 mg at 08/29/22 0249   insulin aspart (novoLOG) injection 0-5 Units  0-5 Units Subcutaneous QHS Rai, Ripudeep K, MD       insulin aspart (novoLOG) injection 0-9 Units  0-9 Units Subcutaneous TID WC Rai, Ripudeep K, MD   2 Units at 08/29/22 1346   insulin aspart (novoLOG) injection 3 Units  3 Units Subcutaneous TID WC Rai, Ripudeep K, MD   3 Units at 08/29/22 1346   menthol-cetylpyridinium (CEPACOL) lozenge 3 mg  1 lozenge Oral PRN Marybelle Killings, MD       Or   phenol (CHLORASEPTIC) mouth spray 1 spray  1 spray Mouth/Throat PRN Marybelle Killings, MD       metoCLOPramide (REGLAN) tablet 5-10 mg  5-10 mg Oral Q8H PRN Marybelle Killings, MD       Or   metoCLOPramide (REGLAN) injection 5-10 mg  5-10 mg Intravenous Q8H PRN Marybelle Killings, MD       Oral care mouth rinse  15 mL Mouth Rinse PRN Rai, Ripudeep K, MD       oxyCODONE  (Oxy IR/ROXICODONE) immediate release tablet 5 mg  5 mg Oral Q4H PRN Rai, Ripudeep K, MD   5 mg at 08/29/22 0853   polyethylene glycol (MIRALAX / GLYCOLAX) packet 17 g  17 g Oral Daily Rai, Ripudeep K, MD   17 g at 08/29/22 1209     Discharge Medications: Please see discharge summary for a list of discharge medications.  Relevant Imaging Results:  Relevant Lab Results:   Additional Information SS# [id number]  Lennart Pall, LCSW

## 2022-08-29 NOTE — TOC Initial Note (Signed)
Transition of Care [facility]) - Initial/Assessment Note    Patient Details  Name: Lauren Frederick MRN: [id number] Date of Birth: 06-27-1940  [facility]) CM/SW Contact:    Lennart Pall, LCSW Phone Number: 08/29/2022, 2:24 PM  Clinical Narrative:                 Met with pt and daughter today to review TOC/ CSW role with dc planning needs.  Both confirming that pt living with her daughter who does work f/t.  They are aware of therapy recommendations for SNF rehab at dc and are agreeable with this plan.  Will begin SNF bed search.  Expected Discharge Plan: Skilled Nursing Facility Barriers to Discharge: Continued Medical Work up, Ship broker, SNF Pending bed offer   Patient Goals and CMS Choice Patient states their goals for this hospitalization and ongoing recovery are:: return home with daughter following rehab          Expected Discharge Plan and Services In-house Referral: Clinical Social Work   Post Acute Care Choice: [place] Living arrangements for the past 2 months: Single Family Home                                      Prior Living Arrangements/Services Living arrangements for the past 2 months: Single Family Home Lives with:: Adult Children Patient language and need for interpreter reviewed:: Yes Do you feel safe going back to the place where you live?: Yes      Need for Family Participation in Patient Care: Yes (Comment) Care giver support system in place?: Yes (comment)   Criminal Activity/Legal Involvement Pertinent to Current Situation/Hospitalization: No - Comment as needed  Activities of Daily Living Home Assistive Devices/Equipment: Cane (specify quad or straight) ADL Screening (condition at time of admission) Patient's cognitive ability adequate to safely complete daily activities?: No Is the patient deaf or have difficulty hearing?: No Does the patient have difficulty seeing, even when wearing  glasses/contacts?: No Does the patient have difficulty concentrating, remembering, or making decisions?: Yes Patient able to express need for assistance with ADLs?: Yes Does the patient have difficulty dressing or bathing?: No Independently performs ADLs?: Yes (appropriate for developmental age) Does the patient have difficulty walking or climbing stairs?: Yes Weakness of Legs: Right Weakness of Arms/Hands: None  Permission Sought/Granted Permission sought to share information with : Family Supports    Share Information with NAME: daughter, Meili Riggan [phone]  Permission granted to share info w AGENCY: SNFs        Emotional Assessment Appearance:: Appears stated age Attitude/Demeanor/Rapport: Gracious Affect (typically observed): Accepting Orientation: : Oriented to Self, Oriented to Place Alcohol / Substance Use: Not Applicable Psych Involvement: No (comment)  Admission diagnosis:  Closed right hip fracture ([place]) [S72.001A] Closed fracture of right hip, initial encounter [facility]) [S72.001A] Patient Active Problem List   Diagnosis Date Noted   Intertrochanteric fracture of right hip, closed, initial encounter ([place]) 08/28/2022   Fall at home, initial encounter 08/28/2022   Leukocytosis 08/28/2022   Prolonged QT interval 08/28/2022   Essential hypertension 08/28/2022   DM2 (diabetes mellitus, type 2) ([place]) 08/28/2022   Hypokalemia 02/08/2016   PCP:  Derinda Late, MD Pharmacy[facility]   [id number]: [phone]: [phone]    Social Determinants of Health (SDOH) Social History: SDOH  Screenings   Tobacco Use: Low Risk  (08/28/2022)   SDOH Interventions:     Readmission Risk Interventions    08/29/2022    2:21 PM  Readmission Risk Prevention Plan  Post Dischage Appt Complete  Medication Screening Complete  Transportation Screening Complete

## 2022-08-29 NOTE — Plan of Care (Signed)
  Problem: Coping: Goal: Level of anxiety will decrease Outcome: Progressing   Problem: Pain Managment: Goal: General experience of comfort will improve Outcome: Progressing   Problem: Safety: Goal: Ability to remain free from injury will improve Outcome: Progressing   

## 2022-08-29 NOTE — Evaluation (Signed)
Physical Therapy Evaluation Patient Details Name: Lauren Frederick MRN: [id number] DOB: [birth year] Today's Date: 08/29/2022  History of Present Illness  LINDSI Frederick is a 82 y.o. female with medical history significant for type 2 diabetes mellitus, essential hypertension, dementia, who is admitted to [facility] on 08/27/2022 with acute right intertrochanteric right femur fracture after presenting from home to [facility] ED complaining of fall. On 3/10 pt underwent Right trochanteric intramedullary nail for right intertrochanteric hip fracture under Surgeon: Rodell Perna, MD  Clinical Impression  Pt admitted with above diagnosis.  Recommend SNF, will follow in acute setting   Pt currently with functional limitations due to the deficits listed below (see PT Problem List). Pt will benefit from skilled PT to increase their independence and safety with mobility to allow discharge to the venue listed below.          Recommendations for follow up therapy are one component of a multi-disciplinary discharge planning process, led by the attending physician.  Recommendations may be updated based on patient status, additional functional criteria and insurance authorization.  Follow Up Recommendations Skilled nursing-short term rehab (<3 hours/day) Can patient physically be transported by private vehicle: No    Assistance Recommended at Discharge    Patient can return home with the following  Two people to help with walking and/or transfers;Two people to help with bathing/dressing/bathroom;Direct supervision/assist for medications management;Help with stairs or ramp for entrance;Assistance with cooking/housework;Assist for transportation    Equipment Recommendations None recommended by PT  Recommendations for Other Services       Functional Status Assessment Patient has had a recent decline in their functional status and demonstrates the ability to make significant improvements in function in a  reasonable and predictable amount of time.     Precautions / Restrictions Precautions Precautions: Fall Precaution Comments: advanced dementia Restrictions Weight Bearing Restrictions: No RLE Weight Bearing: Weight bearing as tolerated      Mobility  Bed Mobility Overal bed mobility: Needs Assistance Bed Mobility: Supine to Sit, Sit to Supine, Rolling Rolling: Mod assist, +2 for physical assistance   Supine to sit: Max assist, Mod assist, +2 for physical assistance, +2 for safety/equipment Sit to supine: Max assist, +2 for physical assistance, +2 for safety/equipment   General bed mobility comments: difficulty following commands, perseverating on pain and scratching (asked dtr to deter pt scratching); requiring hand over hand cues for rolling; assisted to EOB unable to stand d/t pain    Transfers                        Ambulation/Gait                  Stairs            Wheelchair Mobility    Modified Rankin (Stroke Patients Only)       Balance   Sitting-balance support: Feet supported, No upper extremity supported Sitting balance-Leahy Scale: Fair         Standing balance comment: unable                             Pertinent Vitals/Pain Pain Assessment Pain Assessment: Faces Faces Pain Scale: Hurts worst Pain Location: RT hip Pain Intervention(s): Limited activity within patient's tolerance, Monitored during session, Premedicated before session, Repositioned    Home Living Family/patient expects to be discharged to:: Skilled nursing facility Living Arrangements: Children Available Help at Discharge: Available  PRN/intermittently (Daughter works and commutes to [employer] for work, so gone for long days.) Type of Home: [employer] Access: Stairs to enter Entrance Stairs-Rails: Building surveyor of Steps: [place]: Two level;Able to live on main level with bedroom/bathroom Home Equipment: Lauren Frederick - single  point Additional Comments: pt has lived with dtr since 2006, very Independent per dtr at her baseline    Prior Function               Mobility Comments: Used a cane occassionally ADLs Comments: "I did fair" I did it myself".  No family in room to clarify. Presuming pt was helped with IADLs due to dementia     Hand Dominance   Dominant Hand: Right    Extremity/Trunk Assessment   Upper Extremity Assessment Upper Extremity Assessment: Defer to OT evaluation    Lower Extremity Assessment Lower Extremity Assessment: RLE deficits/detail RLE: Unable to fully assess due to pain       Communication   Communication: HOH  Cognition Arousal/Alertness: Awake/alert   Overall Cognitive Status: History of cognitive impairments - at baseline                                          General Comments      Exercises     Assessment/Plan    PT Assessment Patient needs continued PT services  PT Problem List Decreased strength;Decreased range of motion;Decreased activity tolerance       PT Treatment Interventions DME instruction;Gait training;Functional mobility training;Therapeutic activities;Therapeutic exercise;Patient/family education    PT Goals (Current goals can be found in the Care Plan section)  Acute Rehab PT Goals Patient Stated Goal: rehab PT Goal Formulation: With family Time For Goal Achievement: 09/12/22 Potential to Achieve Goals: Good    Frequency Min 3X/week     Co-evaluation               AM-PAC PT "6 Clicks" Mobility  Outcome Measure Help needed turning from your back to your side while in a flat bed without using bedrails?: Total Help needed moving from lying on your back to sitting on the side of a flat bed without using bedrails?: Total Help needed moving to and from a bed to a chair (including a wheelchair)?: Total Help needed standing up from a chair using your arms (e.g., wheelchair or bedside chair)?: Total Help needed  to walk in hospital room?: Total Help needed climbing 3-5 steps with a railing? : Total 6 Click Score: 6    End of Session   Activity Tolerance: Patient tolerated treatment well   Nurse Communication: Mobility status PT Visit Diagnosis: Other abnormalities of gait and mobility (R26.89);Difficulty in walking, not elsewhere classified (R26.2)    Time: [id number] PT Time Calculation (min) (ACUTE ONLY): 28 min   Charges:   PT Evaluation $PT Eval Low Complexity: 1 Low PT Treatments $Therapeutic Activity: 8-22 mins        Baxter Flattery, PT  Acute Rehab Dept [facility]) [phone]  [facility] Weekend Pager [facility] only)  [phone]  08/29/2022   [facility] 08/29/2022, 2:50 PM

## 2022-08-29 NOTE — Progress Notes (Signed)
Patient ID: Lauren Frederick, female   DOB: Oct 06, 1940, 82 y.o.   MRN: [id number]   Subjective: 1 Day Post-Op Procedure(s) (LRB): INTRAMEDULLARY (IM) NAIL INTERTROCHANTERIC (Right) Patient reports pain as moderate.    Objective: Vital signs in last 24 hours: Temp:  [97.6 F (36.4 C)-98.8 F (37.1 C)] 98.5 F (36.9 C) (03/11 0702) Pulse Rate:  [88-109] 105 (03/11 0702) Resp:  [15-20] 17 (03/11 0702) BP: (112-146)/(63-80) 145/80 (03/11 0702) SpO2:  [85 %-100 %] 87 % (03/11 0702) Weight:  [75.8 kg] 75.8 kg (03/11 0500)  Intake/Output from previous day: 03/10 0701 - 03/11 0700 In: 2691.1 [P.O.:360; I.V.:2244.1; IV Piggyback:87] Out: [id number] [Urine:1300; Blood:50] Intake/Output this shift: No intake/output data recorded.  Recent Labs    08/27/22 2316 08/28/22 0412 08/29/22 0322  HGB 13.3 12.4 11.4*   Recent Labs    08/28/22 0412 08/29/22 0322  WBC 11.4* 10.6*  RBC 4.18 3.86*  HCT 38.2 36.0  PLT 214 170   Recent Labs    08/28/22 0133 08/29/22 0322  NA 133* 131*  K 3.4* 3.7  CL 97* 99  CO2 26 24  BUN 11 11  CREATININE 0.78 0.71  GLUCOSE 209* 247*  CALCIUM 8.6* 8.2*   Recent Labs    08/27/22 2316  INR 1.1    Neurologically intact Pelvis Portable  Result Date: 08/28/2022 CLINICAL DATA:  Postop RIGHT hip surgery today. EXAM: PORTABLE PELVIS 1-2 VIEWS COMPARISON:  08/27/2022, 08/28/2022 FINDINGS: Interval ORIF of RIGHT intertrochanteric fracture with lag screw and intramedullary nail. The hip appears normally located on this frontal view. Post surgical clips and soft tissue gas identified LATERAL to the RIGHT hip. No new fractures identified. IMPRESSION: Interval ORIF of RIGHT intertrochanteric fracture. Electronically Signed   By: Nolon Nations M.D.   On: 08/28/2022 12:19   DG HIP UNILAT WITH PELVIS 2-3 VIEWS RIGHT  Result Date: 08/28/2022 CLINICAL DATA:  Elective surgery. EXAM: DG HIP (WITH OR WITHOUT PELVIS) 2-3V RIGHT COMPARISON:  Right hip radiographs  08/27/2022 FLUOROSCOPY: Radiation Exposure Index (as provided by the fluoroscopic device): 7.12 mGy Kerma FINDINGS: Three intraoperative spot fluoroscopic images are provided. A right femoral intramedullary nail and proximal and distal interlocking screws have been placed for treatment of the previously shown intertrochanteric fracture. Alignment is grossly anatomic. IMPRESSION: Intraoperative images during right femoral ORIF. Electronically Signed   By: Logan Bores M.D.   On: 08/28/2022 11:56   DG C-Arm 1-60 Min-No Report  Result Date: 08/28/2022 Fluoroscopy was utilized by the requesting physician.  No radiographic interpretation.    Assessment/Plan: 1 Day Post-Op Procedure(s) (LRB): INTRAMEDULLARY (IM) NAIL INTERTROCHANTERIC (Right) Up with therapy.   Foley out this AM. Rx for percocet 5/325 one po q 6 hrs prn pain placed on chart.  Social service home vs short term SNF placement. She is WBAT with Ardith Dark 08/29/2022, 9:02 AM

## 2022-08-29 NOTE — Progress Notes (Signed)
[facility]                                                                              Lauren Frederick, is a 82 y.o. female, DOB - 1940/12/02, [id number] Admit date - 08/27/2022    Outpatient Primary MD for the patient is Derinda Late, MD  LOS - 1  days  Chief Complaint  Patient presents with   Fall            Brief summary   Patient is a 82 year old female with diabetes type 2, HTN, dementia presented with a ground-level fall in a parking lot due to the car door knocking the patient off balance.   Patient had severe right hip pain and unable to bear weight.  No dizziness, lightheadedness, LOC or syncopal episode. EMS was called, x-ray showed right nondisplaced intertrochanteric hip fracture Patient was admitted for workup.  Assessment & Plan    Principal Problem:   Closed nondisplaced intertrochanteric fracture of right femur ([place]),  mechanical fall -s/p right IM nail intertrochanteric surgery on 3/10, post op day #1   - continue pain control, DVT prophylaxis  - placed on bowel regimen  - f/u H/H    Active Problems:   Fall , initial encounter -PT OT evaluation today  -Per patient lives at home with her daughter  Mild hyponatremia, hypokalemia -stable     Leukocytosis -Improving, likely reactive -UA negative for UTI.  Chest x-ray negative for any pneumonia    Prolonged QT interval -QTc very prolonged 645 -Hold amitriptyline, Aricept, keep K >4, mg ~2 -Avoid QT prolonging meds, recheck EKG in AM     Essential hypertension -BP currently stable, resume amlodipine, lisinopril    DM2 (diabetes mellitus, type 2) (HCC) -Hold glipizide, hemoglobin A1c 7.1 -CBG (last 3)  Recent Labs    08/28/22 1706 08/28/22 2314 08/29/22 0728  GLUCAP 173* 190* 171*   -Continue sliding scale insulin, added meal coverage 3 units 3 times daily AC  Hyperlipidemia -Continue Zetia  History of dementia -Hold Aricept due to QT prolongation  Estimated body  mass index is 27.81 kg/m as calculated from the following:   Height as of this encounter: '5\' 5"'$  (1.651 m).   Weight as of this encounter: 75.8 kg.  Code Status: Full code DVT Prophylaxis:  enoxaparin (LOVENOX) injection 40 mg Start: 08/29/22 0800 SCDs Start: 08/28/22 1219   Level of Care: Level of care: Med-Surg Family Communication: Updated patient Disposition Plan:      Remains inpatient appropriate: Await PT evaluation after surgery   Procedures:    Consultants:   Orthopedics  Antimicrobials:   Anti-infectives (From admission, onward)    Start     Dose/Rate Route Frequency Ordered Stop   08/28/22 0849  ceFAZolin (ANCEF) 2-4 GM/100ML-% IVPB       Note to Pharmacy: Dara Lords M: cabinet override      08/28/22 0849 08/28/22 1007   08/28/22 0815  ceFAZolin (ANCEF) IVPB 2g/100 mL premix        2 g 200 mL/hr over 30 Minutes Intravenous On call to O.R. 08/28/22 [id number] 08/28/22 1003  Medications  amLODipine  5 mg Oral Daily   docusate sodium  100 mg Oral BID   enoxaparin (LOVENOX) injection  40 mg Subcutaneous Q24H   insulin aspart  0-5 Units Subcutaneous QHS   insulin aspart  0-9 Units Subcutaneous TID WC   polyethylene glycol  17 g Oral Daily      Subjective:   Lauren Frederick was seen and examined today.  Pain in the right hip, no other acute issues, no fevers or chills, nausea vomiting or abdominal pain.   Objective:   Vitals:   08/29/22 0218 08/29/22 0235 08/29/22 0500 08/29/22 0702  BP: 138/70   (!) 145/80  Pulse: (!) 109   (!) 105  Resp: 18   17  Temp: 98.8 F (37.1 C)   98.5 F (36.9 C)  TempSrc:    Oral  SpO2: (!) 85% 92%  (!) 87%  Weight:   75.8 kg   Height:        Intake/Output Summary (Last 24 hours) at 08/29/2022 1131 Last data filed at 08/29/2022 1054 Gross per 24 hour  Intake 2155.81 ml  Output 1300 ml  Net 855.81 ml     Wt Readings from Last 3 Encounters:  08/29/22 75.8 kg  02/08/16 74.8 kg  04/28/15 72.8 kg   Physical  Exam General: Alert and oriented x 3, NAD Cardiovascular: S1 S2 clear, RRR.  Respiratory: CTAB, no wheezing Gastrointestinal: Soft, nontender, nondistended, NBS Ext: no pedal edema bilaterally Neuro: no new deficits Psych: Normal affect    Data Reviewed:  I have personally reviewed following labs    CBC Lab Results  Component Value Date   WBC 10.6 (H) 08/29/2022   RBC 3.86 (L) 08/29/2022   HGB 11.4 (L) 08/29/2022   HCT 36.0 08/29/2022   MCV 93.3 08/29/2022   MCH 29.5 08/29/2022   PLT 170 08/29/2022   MCHC 31.7 08/29/2022   RDW 13.4 08/29/2022   LYMPHSABS 1.5 08/28/2022   MONOABS 0.9 08/28/2022   EOSABS 0.0 08/28/2022   BASOSABS 0.0 [id number]     Last metabolic panel Lab Results  Component Value Date   NA 131 (L) 08/29/2022   K 3.7 08/29/2022   CL 99 08/29/2022   CO2 24 08/29/2022   BUN 11 08/29/2022   CREATININE 0.71 08/29/2022   GLUCOSE 247 (H) 08/29/2022   GFRNONAA >60 08/29/2022   GFRAA >60 02/09/2016   CALCIUM 8.2 (L) 08/29/2022   PROT 7.1 08/28/2022   ALBUMIN 3.9 08/28/2022   BILITOT 0.4 08/28/2022   ALKPHOS 69 08/28/2022   AST 28 08/28/2022   ALT 23 08/28/2022   ANIONGAP 8 08/29/2022    CBG (last 3)  Recent Labs    08/28/22 1706 08/28/22 2314 08/29/22 0728  GLUCAP 173* 190* 171*      Coagulation Profile: Recent Labs  Lab 08/27/22 2316  INR 1.1     Radiology Studies: I have personally reviewed the imaging studies  Pelvis Portable  Result Date: 08/28/2022 CLINICAL DATA:  Postop RIGHT hip surgery today. EXAM: PORTABLE PELVIS 1-2 VIEWS COMPARISON:  08/27/2022, 08/28/2022 FINDINGS: Interval ORIF of RIGHT intertrochanteric fracture with lag screw and intramedullary nail. The hip appears normally located on this frontal view. Post surgical clips and soft tissue gas identified LATERAL to the RIGHT hip. No new fractures identified. IMPRESSION: Interval ORIF of RIGHT intertrochanteric fracture. Electronically Signed   By: Nolon Nations M.D.    On: 08/28/2022 12:19   DG HIP UNILAT WITH PELVIS 2-3 VIEWS RIGHT  Result Date:  08/28/2022 CLINICAL DATA:  Elective surgery. EXAM: DG HIP (WITH OR WITHOUT PELVIS) 2-3V RIGHT COMPARISON:  Right hip radiographs 08/27/2022 FLUOROSCOPY: Radiation Exposure Index (as provided by the fluoroscopic device): 7.12 mGy Kerma FINDINGS: Three intraoperative spot fluoroscopic images are provided. A right femoral intramedullary nail and proximal and distal interlocking screws have been placed for treatment of the previously shown intertrochanteric fracture. Alignment is grossly anatomic. IMPRESSION: Intraoperative images during right femoral ORIF. Electronically Signed   By: Logan Bores M.D.   On: 08/28/2022 11:56   DG C-Arm 1-60 Min-No Report  Result Date: 08/28/2022 Fluoroscopy was utilized by the requesting physician.  No radiographic interpretation.   DG Chest 1 View  Result Date: 08/28/2022 CLINICAL DATA:  Hip fracture.  Preop EXAM: CHEST  1 VIEW COMPARISON:  Report from radiograph Nov 13, 1996 FINDINGS: Normal cardiomediastinal silhouette. No focal consolidation, pleural effusion, or pneumothorax. No acute osseous abnormality. IMPRESSION: No active disease. Electronically Signed   By: Placido Sou M.D.   On: 08/28/2022 00:10   DG Hip Unilat  With Pelvis 2-3 Views Right  Result Date: 08/27/2022 CLINICAL DATA:  Fall and right hip pain. EXAM: DG HIP (WITH OR WITHOUT PELVIS) 2-3V RIGHT COMPARISON:  CT of the abdomen pelvis 02/08/2016. FINDINGS: Evaluation is limited due to osteopenia and body habitus. There is a nondisplaced intertrochanteric fracture of the right femur. No other acute fracture. The bones are osteopenic. Mild bilateral hip arthritic changes. The soft tissues are unremarkable. IMPRESSION: Nondisplaced intertrochanteric fracture of the right femur. Electronically Signed   By: Anner Crete M.D.   On: 08/27/2022 22:24       Averill Pons M.D. [facility] 08/29/2022, 11:31  AM  Available via Epic secure chat 7am-7pm After 7 pm, please refer to night coverage provider listed on amion.

## 2022-08-29 NOTE — Evaluation (Signed)
Occupational Therapy Evaluation Patient Details Name: Lauren Frederick MRN: [id number] DOB: February 27, 1941 Today's Date: 08/29/2022   History of Present Illness Lauren Frederick is a 82 y.o. female with medical history significant for type 2 diabetes mellitus, essential hypertension, dementia, who is admitted to [facility] on 08/27/2022 with acute right intertrochanteric right femur fracture after presenting from home to [facility] ED complaining of fall. On 3/10 pt underwent Right trochanteric intramedullary nail for right intertrochanteric hip fracture under Surgeon: Rodell Perna, MD   Clinical Impression   Patient is currently requiring assistance with ADLs including up to total assist with bed level Lower body ADLs, up to moderate assist with Upper body ADLs,  as well as  up to max assist of 2 people assist with bed mobility and inability to perform a stand from EOB with assist of 1 person or functional transfers to toilet.    Current level of function is below patient's typical baseline which is somewhat unclear as family was unavailable to give information but per chart pt was ambulating unassisted.    During this evaluation, patient was limited by generalized weakness, impaired activity tolerance, post-op pain of 8/10 and wong/baker 10/10 and baseline dementia, all of which has the potential to impact patient's safety and independence during functional mobility, as well as performance for ADLs.  Patient lives at home with her daughter,  who is unable to provide 24/7 supervision and assistance due to work and a long commute to [facility]  Patient demonstrates fair to good rehab potential, and should benefit from continued skilled occupational therapy services while in acute care to maximize safety, independence and quality of life at home.  Continued occupational therapy services in a SNF setting prior to return home is recommended.  ?    Recommendations for follow up therapy are one component of a  multi-disciplinary discharge planning process, led by the attending physician.  Recommendations may be updated based on patient status, additional functional criteria and insurance authorization.   Follow Up Recommendations  Skilled nursing-short term rehab (<3 hours/day)     Assistance Recommended at Discharge Frequent or constant Supervision/Assistance  Patient can return home with the following Help with stairs or ramp for entrance;Assist for transportation;Assistance with cooking/housework;Two people to help with walking and/or transfers;Direct supervision/assist for financial management;Two people to help with bathing/dressing/bathroom;A lot of help with walking and/or transfers;A lot of help with bathing/dressing/bathroom;Direct supervision/assist for medications management    Functional Status Assessment  Patient has had a recent decline in their functional status and demonstrates the ability to make significant improvements in function in a reasonable and predictable amount of time.  Equipment Recommendations   (Defer to post acute recommendations)    Recommendations for Other Services       Precautions / Restrictions Precautions Precautions: Fall Precaution Comments: advanced dementia Restrictions Weight Bearing Restrictions: No RLE Weight Bearing: Weight bearing as tolerated      Mobility Bed Mobility Overal bed mobility: Needs Assistance Bed Mobility: Supine to Sit, Sit to Supine, Rolling Rolling: Mod assist, +2 for physical assistance   Supine to sit: Max assist, HOB elevated Sit to supine: Max assist, +2 for physical assistance   General bed mobility comments: Pt spasming and in pain. Has difficulty following more than 1-step simple instructions.    Transfers                          Balance Overall balance assessment: Needs assistance Sitting-balance support: Bilateral  upper extremity supported, Feet supported Sitting balance-Leahy Scale:  Poor Sitting balance - Comments: Pt has apparent spasms of pain while sitting EOB and needs external assist to steady herself.                                   ADL either performed or assessed with clinical judgement   ADL Overall ADL's : Needs assistance/impaired Eating/Feeding: Set up;Supervision/ safety;Bed level   Grooming: Wash/dry hands;Wash/dry face;Set up;Bed level Grooming Details (indicate cue type and reason): Unable to release hands from EOB except to grip RW. Upper Body Bathing: Moderate assistance;Bed level;Cueing for sequencing   Lower Body Bathing: Total assistance;Bed level;+2 for physical assistance;Cueing for sequencing   Upper Body Dressing : Moderate assistance;Bed level;Cueing for sequencing   Lower Body Dressing: Total assistance;Bed level     Toilet Transfer Details (indicate cue type and reason): Unable to stand. See note under cognition. Toileting- Clothing Manipulation and Hygiene: +2 for physical assistance;Total assistance;Bed level       Functional mobility during ADLs: Maximal assistance;Total assistance;+2 for physical assistance       Vision   Vision Assessment?: No apparent visual deficits     Perception     Praxis      Pertinent Vitals/Pain Pain Assessment Pain Assessment: Faces Pain Score: 8  Faces Pain Scale: Hurts worst Pain Location: RT hip Pain Intervention(s): Limited activity within patient's tolerance, Monitored during session, Patient requesting pain meds-RN notified, RN gave pain meds during session, Utilized relaxation techniques     Hand Dominance Right   Extremity/Trunk Assessment Upper Extremity Assessment Upper Extremity Assessment: Generalized weakness   Lower Extremity Assessment Lower Extremity Assessment: Defer to PT evaluation       Communication Communication Communication: HOH   Cognition Arousal/Alertness: Awake/alert Behavior During Therapy: WFL for tasks assessed/performed Overall  Cognitive Status: History of cognitive impairments - at baseline                                 General Comments: h/o dementia. Oriented to person, place and situation. Not month or year. Repeats self at times.  Does not retain new instructions long. Pt and OT got caught in a memory loop once pt EOB with pt asking, "What are we doing?" OT would explain we would attempt to stand.  Pt would say, "Alright, give me a minute." Then after a few seconds, pt repeated, "Now, what are we doing?"  Unable to stand this visit due to pt having diffiuclty retraining instructions to complete kinetic chain.     General Comments       Exercises     Shoulder Instructions      Home Living Family/patient expects to be discharged to:: Skilled nursing facility Living Arrangements: Children Available Help at Discharge: Available PRN/intermittently (Daughter works and commutes to [employer] for work, so gone for long days.) Type of Home: [facility] Access: Stairs to enter Technical brewer of Steps: 6 Entrance Stairs-Rails: Right [place]: Two level;Able to live on main level with bedroom/bathroom     Bathroom Shower/Tub: Tub/shower unit;Sponge bathes at baseline   Bathroom Toilet: Handicapped height (Pt unsure but thinks ther's a riser)     Home Equipment: Cane - single point   Additional Comments: pt has lived with dtr since 2006, very Independent per dtr at her baseline      Prior Functioning/Environment  Mobility Comments: Used a cane occassionally ADLs Comments: "I did fair" I did it myself".  No family in room to clarify. Presuming pt was helped with IADLs due to dementia        OT Problem List: Pain;Decreased strength;Decreased cognition;Decreased activity tolerance;Decreased safety awareness;Impaired balance (sitting and/or standing);Decreased knowledge of use of DME or AE;Decreased knowledge of precautions      OT Treatment/Interventions:  Self-care/ADL training;Therapeutic exercise;Therapeutic activities;Energy conservation;Patient/family education;DME and/or AE instruction;Balance training;Cognitive remediation/compensation    OT Goals(Current goals can be found in the care plan section) Acute Rehab OT Goals OT Goal Formulation: Patient unable to participate in goal setting Time For Goal Achievement: 09/12/22 Potential to Achieve Goals: Fair ADL Goals Pt Will Perform Grooming: sitting;with supervision;with set-up (EOB with at least fair sitting balance) Pt Will Perform Lower Body Dressing: with min assist;bed level;sit to/from stand;sitting/lateral leans (In least restrictive position for safety) Pt Will Transfer to Toilet: with min guard assist;stand pivot transfer;bedside commode Pt Will Perform Toileting - Clothing Manipulation and hygiene: with min assist;with caregiver independent in assisting;with adaptive equipment;sitting/lateral leans;sit to/from stand Pt/caregiver will Perform Home Exercise Program: With Supervision;Increased strength;Both right and left upper extremity (In least restrictive postion for safety.)  OT Frequency: Min 2X/week    Co-evaluation              AM-PAC OT "6 Clicks" Daily Activity     Outcome Measure Help from another person eating meals?: A Little Help from another person taking care of personal grooming?: A Little Help from another person toileting, which includes using toliet, bedpan, or urinal?: Total Help from another person bathing (including washing, rinsing, drying)?: A Lot Help from another person to put on and taking off regular upper body clothing?: A Lot Help from another person to put on and taking off regular lower body clothing?: Total 6 Click Score: 12   End of Session Nurse Communication: Mobility status;Patient requests pain meds  Activity Tolerance: Patient limited by pain;Other (comment) (Limited by cognitive deficits) Patient left: in bed;with SCD's  reapplied;with call bell/phone within reach;with bed alarm set;with nursing/sitter in room  OT Visit Diagnosis: Unsteadiness on feet (R26.81);Other symptoms and signs involving cognitive function;Pain;History of falling (Z91.81);Muscle weakness (generalized) (M62.81) Pain - Right/Left: Right Pain - part of body: Hip                Time: 1315-1355 OT Time Calculation (min): 40 min Charges:  OT General Charges $OT Visit: 1 Visit OT Evaluation $OT Eval Low Complexity: 1 Low OT Treatments $Self Care/Home Management : 8-22 mins $Therapeutic Activity: 8-22 mins  Anderson Malta, [place] Office: [phone] 08/29/2022  Julien Girt 08/29/2022, 2:16 PM

## 2022-08-29 NOTE — Discharge Instructions (Addendum)
Weight bearing as tolerated right leg  with walker.  See Dr. Lorin Mercy in 1-2 wks for follow up visit.   POST-OPERATIVE OPIOID TAPER INSTRUCTIONS: It is important to wean off of your opioid medication as soon as possible. If you do not need pain medication after your surgery it is ok to stop day one. Opioids include: Codeine, Hydrocodone(Norco, Vicodin), Oxycodone(Percocet, oxycontin) and hydromorphone amongst others.  Long term and even short term use of opiods can cause: Increased pain response Dependence Constipation Depression Respiratory depression And more.  Withdrawal symptoms can include Flu like symptoms Nausea, vomiting And more Techniques to manage these symptoms Hydrate well Eat regular healthy meals Stay active Use relaxation techniques(deep breathing, meditating, yoga) Do Not substitute Alcohol to help with tapering If you have been on opioids for less than two weeks and do not have pain than it is ok to stop all together.  Plan to wean off of opioids This plan should start within one week post op of your joint replacement. Maintain the same interval or time between taking each dose and first decrease the dose.  Cut the total daily intake of opioids by one tablet each day Next start to increase the time between doses. The last dose that should be eliminated is the evening dose.

## 2022-08-30 DIAGNOSIS — S72141A Displaced intertrochanteric fracture of right femur, initial encounter for closed fracture: Secondary | ICD-10-CM | POA: Diagnosis not present

## 2022-08-30 DIAGNOSIS — E119 Type 2 diabetes mellitus without complications: Secondary | ICD-10-CM | POA: Diagnosis not present

## 2022-08-30 DIAGNOSIS — S72001A Fracture of unspecified part of neck of right femur, initial encounter for closed fracture: Secondary | ICD-10-CM | POA: Diagnosis not present

## 2022-08-30 DIAGNOSIS — W19XXXA Unspecified fall, initial encounter: Secondary | ICD-10-CM | POA: Diagnosis not present

## 2022-08-30 LAB — GLUCOSE, CAPILLARY
Glucose-Capillary: 152 mg/dL — ABNORMAL HIGH (ref 70–99)
Glucose-Capillary: 195 mg/dL — ABNORMAL HIGH (ref 70–99)
Glucose-Capillary: 150 mg/dL — ABNORMAL HIGH (ref 70–99)
Glucose-Capillary: 163 mg/dL — ABNORMAL HIGH (ref 70–99)

## 2022-08-30 LAB — CBC
HCT: 34.3 % — ABNORMAL LOW (ref 36.0–46.0)
Hemoglobin: 11.1 g/dL — ABNORMAL LOW (ref 12.0–15.0)
MCH: 29.4 pg (ref 26.0–34.0)
MCHC: 32.4 g/dL (ref 30.0–36.0)
MCV: 90.7 fL (ref 80.0–100.0)
Platelets: 170 10*3/uL (ref 150–400)
RBC: 3.78 MIL/uL — ABNORMAL LOW (ref 3.87–5.11)
RDW: 13.1 % (ref 11.5–15.5)
WBC: 9.3 10*3/uL (ref 4.0–10.5)
nRBC: 0 % (ref 0.0–0.2)

## 2022-08-30 LAB — BASIC METABOLIC PANEL
Anion gap: 9 (ref 5–15)
BUN: 7 mg/dL — ABNORMAL LOW (ref 8–23)
CO2: 26 mmol/L (ref 22–32)
Calcium: 8.2 mg/dL — ABNORMAL LOW (ref 8.9–10.3)
Chloride: 96 mmol/L — ABNORMAL LOW (ref 98–111)
Creatinine, Ser: 0.51 mg/dL (ref 0.44–1.00)
GFR, Estimated: >60 mL/min (ref >60)
Glucose, Bld: 169 mg/dL — ABNORMAL HIGH (ref 70–99)
Potassium: 3.7 mmol/L (ref 3.5–5.1)
Sodium: 131 mmol/L — ABNORMAL LOW (ref 135–145)

## 2022-08-30 MED ORDER — INSULIN ASPART 100 UNIT/ML IJ SOLN
0.0000 [IU] | Freq: Three times a day (TID) | INTRAMUSCULAR | Status: DC
  Administered 2022-08-30 – 2022-08-31 (×2): 2 [IU] via SUBCUTANEOUS
  Administered 2022-08-31: 3 [IU] via SUBCUTANEOUS

## 2022-08-30 NOTE — NC FL2 (Signed)
[place] LEVEL OF CARE FORM     IDENTIFICATION  Patient Name: Lauren Frederick Birthdate: [birth year] Sex: female Admission Date (Current Location): 08/27/2022  [facility] and [place] Number:  Herbalist and Address:  [facility],  [address] [place], [place]      Provider Number: [id number]  Attending Physician Name and Address:  Mendel Corning, MD  Relative Name and Phone Number:  daughter, Ieva Much [phone]    Current Level of Care: Hospital Recommended Level of Care: [place] Prior Approval Number:    Date Approved/Denied:   PASRR Number: [id number]  Discharge Plan: SNF    Current Diagnoses: Patient Active Problem List   Diagnosis Date Noted   Intertrochanteric fracture of right hip, closed, initial encounter ([place]) 08/28/2022   Fall at home, initial encounter 08/28/2022   Leukocytosis 08/28/2022   Prolonged QT interval 08/28/2022   Essential hypertension 08/28/2022   DM2 (diabetes mellitus, type 2) ([place]) 08/28/2022   Hypokalemia 02/08/2016    Orientation RESPIRATION BLADDER Height & Weight     Self, Place  Normal Continent, External catheter (currently with purewick) Weight: 168 lb 3.4 oz (76.3 kg) Height:  '5\' 5"'$  (165.1 cm)  BEHAVIORAL SYMPTOMS/MOOD NEUROLOGICAL BOWEL NUTRITION STATUS      Continent Diet (regular)  AMBULATORY STATUS COMMUNICATION OF NEEDS Skin   Extensive Assist Verbally Other (Comment) (surgical incision only)                       Personal Care Assistance Level of Assistance  Bathing, Dressing Bathing Assistance: Limited assistance   Dressing Assistance: Limited assistance     Functional Limitations Info  Hearing, Speech, Sight Sight Info: Adequate Hearing Info: Adequate Speech Info: Adequate    SPECIAL CARE FACTORS FREQUENCY  PT (By licensed PT), OT (By licensed OT)     PT Frequency: 5x/wk OT Frequency: 5x/wk            Contractures Contractures  Info: Not present    Additional Factors Info  Code Status, Insulin Sliding Scale Code Status Info: Full     Insulin Sliding Scale Info: see MAR       Current Medications (08/30/2022):  This is the current hospital active medication list Current Facility-Administered Medications  Medication Dose Route Frequency Provider Last Rate Last Admin   acetaminophen (TYLENOL) tablet 650 mg  650 mg Oral Q6H PRN Howerter, Justin B, DO   650 mg at 08/30/22 0856   Or   acetaminophen (TYLENOL) suppository 650 mg  650 mg Rectal Q6H PRN Howerter, Justin B, DO       amLODipine (NORVASC) tablet 5 mg  5 mg Oral Daily Rai, Ripudeep K, MD   5 mg at 08/30/22 0857   docusate sodium (COLACE) capsule 100 mg  100 mg Oral BID Marybelle Killings, MD   100 mg at 08/30/22 0857   enoxaparin (LOVENOX) injection 40 mg  40 mg Subcutaneous Q24H Marybelle Killings, MD   40 mg at 08/30/22 0856   HYDROmorphone (DILAUDID) injection 0.5 mg  0.5 mg Intravenous Q2H PRN Howerter, Justin B, DO   0.5 mg at 08/29/22 0249   insulin aspart (novoLOG) injection 0-15 Units  0-15 Units Subcutaneous TID WC Rai, Ripudeep K, MD       insulin aspart (novoLOG) injection 0-5 Units  0-5 Units Subcutaneous QHS Rai, Ripudeep K, MD       insulin aspart (novoLOG) injection 3 Units  3 Units  Subcutaneous TID WC Rai, Ripudeep K, MD   3 Units at 08/30/22 0855   menthol-cetylpyridinium (CEPACOL) lozenge 3 mg  1 lozenge Oral PRN Marybelle Killings, MD       Or   phenol (CHLORASEPTIC) mouth spray 1 spray  1 spray Mouth/Throat PRN Marybelle Killings, MD       metoCLOPramide (REGLAN) tablet 5-10 mg  5-10 mg Oral Q8H PRN Marybelle Killings, MD   10 mg at 08/30/22 [id number]   Or   metoCLOPramide (REGLAN) injection 5-10 mg  5-10 mg Intravenous Q8H PRN Marybelle Killings, MD       Oral care mouth rinse  15 mL Mouth Rinse PRN Rai, Ripudeep K, MD       oxyCODONE (Oxy IR/ROXICODONE) immediate release tablet 5 mg  5 mg Oral Q4H PRN Rai, Ripudeep K, MD   5 mg at 08/30/22 1506   polyethylene glycol  (MIRALAX / GLYCOLAX) packet 17 g  17 g Oral Daily Rai, Ripudeep K, MD   17 g at 08/30/22 [id number]     Discharge Medications: Please see discharge summary for a list of discharge medications.  Relevant Imaging Results:  Relevant Lab Results:   Additional Information SS# [id number]  Lennart Pall, LCSW

## 2022-08-30 NOTE — TOC Progression Note (Signed)
Transition of Care [facility]) - Progression Note    Patient Details  Name: NIELAH KEO MRN: [id number] Date of Birth: 11-01-1940  Transition [facility]) CM/SW Contact  Lennart Pall, LCSW Phone Number: 08/30/2022, 3:09 PM  Clinical Narrative:    Have reviewed SNF bed offers with pt/ daughter and they have accepted bed [facility] who can admit tomorrow.  Insurance authorization begun.  MD and RN aware.   Expected Discharge Plan: [place] Barriers to Discharge: Continued Medical Work up, Ship broker, SNF Pending bed offer  Expected Discharge Plan and Services In-house Referral: Clinical Social Work   Post Acute Care Choice: [place] Living arrangements for the past 2 months: Single Family Home                                       Social Determinants of Health (SDOH) Interventions SDOH Screenings   Tobacco Use: Low Risk  (08/28/2022)    Readmission Risk Interventions    08/29/2022    2:21 PM  Readmission Risk Prevention Plan  Post Dischage Appt Complete  Medication Screening Complete  Transportation Screening Complete

## 2022-08-30 NOTE — Plan of Care (Signed)
Plan of care reviewed. 

## 2022-08-30 NOTE — Progress Notes (Signed)
[facility]                                                                              Lauren Frederick, is a 82 y.o. female, DOB - 1940/07/22, [id number] Admit date - 08/27/2022    Outpatient Primary MD for the patient is Derinda Late, MD  LOS - 2  days  Chief Complaint  Patient presents with   Fall            Brief summary   Patient is a 82 year old female with diabetes type 2, HTN, dementia presented with a ground-level fall in a parking lot due to the car door knocking the patient off balance.   Patient had severe right hip pain and unable to bear weight.  No dizziness, lightheadedness, LOC or syncopal episode. EMS was called, x-ray showed right nondisplaced intertrochanteric hip fracture Patient was admitted for workup.  Assessment & Plan    Principal Problem:   Closed nondisplaced intertrochanteric fracture of right femur ([place]),  mechanical fall -s/p right IM nail intertrochanteric surgery on 3/10, post op day # 2 -Pain controlled -H&H stable -No BM yet, placed on daily MiraLAX, Colace   Active Problems:   Fall , initial encounter -Per patient lives at home with her daughter -PT evaluation completed, recommended SNF for rehab  Mild hyponatremia, hypokalemia -stable     Leukocytosis -Likely reactive, resolved -UA negative for UTI.  Chest x-ray negative for any pneumonia    Prolonged QT interval -QTc very prolonged 645 -Hold amitriptyline, Aricept, keep K >4, mg ~2 -Avoid QT prolonging meds -Repeat EKG    Essential hypertension -BP currently stable, resume amlodipine, lisinopril    DM2 (diabetes mellitus, type 2) (HCC) -Hold glipizide, hemoglobin A1c 7.1 CBG (last 3)  Recent Labs    08/29/22 2140 08/30/22 0723 08/30/22 1148  GLUCAP 153* 152* 195*   -Change SSI to moderate, continue meal coverage 3 units 3 times daily AC   Hyperlipidemia -Continue Zetia  History of dementia -Hold Aricept due to QT  prolongation  Estimated body mass index is 27.99 kg/m as calculated from the following:   Height as of this encounter: '5\' 5"'$  (1.651 m).   Weight as of this encounter: 76.3 kg.  Code Status: Full code DVT Prophylaxis:  enoxaparin (LOVENOX) injection 40 mg Start: 08/29/22 0800 SCDs Start: 08/28/22 1219   Level of Care: Level of care: Med-Surg Family Communication: Updated patient Disposition Plan:      Remains inpatient appropriate: PT recommended SNF  Procedures:  3/10 Procedure: Right trochanteric intramedullary nail for right intertrochanteric hip fracture.   Consultants:   Orthopedics  Antimicrobials:   Anti-infectives (From admission, onward)    Start     Dose/Rate Route Frequency Ordered Stop   08/28/22 0849  ceFAZolin (ANCEF) 2-4 GM/100ML-% IVPB       Note to Pharmacy: Dara Lords M: cabinet override      08/28/22 0849 08/28/22 1007   08/28/22 0815  ceFAZolin (ANCEF) IVPB 2g/100 mL premix        2 g 200 mL/hr over 30 Minutes Intravenous On call to O.R. 08/28/22 [id number] 08/28/22 1003  Medications  amLODipine  5 mg Oral Daily   docusate sodium  100 mg Oral BID   enoxaparin (LOVENOX) injection  40 mg Subcutaneous Q24H   insulin aspart  0-5 Units Subcutaneous QHS   insulin aspart  0-9 Units Subcutaneous TID WC   insulin aspart  3 Units Subcutaneous TID WC   polyethylene glycol  17 g Oral Daily      Subjective:   Lauren Frederick was seen and examined today.  Pain controlled in the right hip, no other issues no nausea vomiting or abdominal pain.  No BM yet.  Objective:   Vitals:   08/29/22 0702 08/29/22 1347 08/29/22 2137 08/30/22 0651  BP: (!) 145/80 (!) 153/75 (!) 152/70 (!) 156/71  Pulse: (!) 105 100 (!) 102 98  Resp: '17 16 18 17  '$ Temp: 98.5 F (36.9 C) 98.7 F (37.1 C) 98.6 F (37 C) 99.6 F (37.6 C)  TempSrc: Oral   Oral  SpO2: (!) 87% 95% 95% 93%  Weight:    76.3 kg  Height:        Intake/Output Summary (Last 24 hours) at 08/30/2022  1243 Last data filed at 08/30/2022 0649 Gross per 24 hour  Intake 1100.03 ml  Output 1900 ml  Net -799.97 ml     Wt Readings from Last 3 Encounters:  08/30/22 76.3 kg  02/08/16 74.8 kg  04/28/15 72.8 kg   Physical Exam General: Alert and oriented x 3, NAD Cardiovascular: S1 S2 clear, RRR.  Respiratory: CTAB Gastrointestinal: Soft, nontender, nondistended, NBS Ext: no pedal edema bilaterally Neuro: no new deficits Psych: Normal affect    Data Reviewed:  I have personally reviewed following labs    CBC Lab Results  Component Value Date   WBC 9.3 08/30/2022   RBC 3.78 (L) 08/30/2022   HGB 11.1 (L) 08/30/2022   HCT 34.3 (L) 08/30/2022   MCV 90.7 08/30/2022   MCH 29.4 08/30/2022   PLT 170 08/30/2022   MCHC 32.4 08/30/2022   RDW 13.1 08/30/2022   LYMPHSABS 1.5 08/28/2022   MONOABS 0.9 08/28/2022   EOSABS 0.0 08/28/2022   BASOSABS 0.0 123456     Last metabolic panel Lab Results  Component Value Date   NA 131 (L) 08/30/2022   K 3.7 08/30/2022   CL 96 (L) 08/30/2022   CO2 26 08/30/2022   BUN 7 (L) 08/30/2022   CREATININE 0.51 08/30/2022   GLUCOSE 169 (H) 08/30/2022   GFRNONAA >60 08/30/2022   GFRAA >60 02/09/2016   CALCIUM 8.2 (L) 08/30/2022   PROT 7.1 08/28/2022   ALBUMIN 3.9 08/28/2022   BILITOT 0.4 08/28/2022   ALKPHOS 69 08/28/2022   AST 28 08/28/2022   ALT 23 08/28/2022   ANIONGAP 9 08/30/2022    CBG (last 3)  Recent Labs    08/29/22 2140 08/30/22 0723 08/30/22 1148  GLUCAP 153* 152* 195*      Coagulation Profile: Recent Labs  Lab 08/27/22 2316  INR 1.1     Radiology Studies: I have personally reviewed the imaging studies  No results found.     Estill Cotta M.D. [facility] 08/30/2022, 12:43 PM  Available via Epic secure chat 7am-7pm After 7 pm, please refer to night coverage provider listed on amion.

## 2022-08-31 ENCOUNTER — Encounter (HOSPITAL_COMMUNITY): Payer: Self-pay | Admitting: Orthopaedic Surgery

## 2022-08-31 LAB — CBC
HCT: 33.8 % — ABNORMAL LOW (ref 36.0–46.0)
Hemoglobin: 10.9 g/dL — ABNORMAL LOW (ref 12.0–15.0)
MCH: 29.1 pg (ref 26.0–34.0)
MCHC: 32.2 g/dL (ref 30.0–36.0)
MCV: 90.4 fL (ref 80.0–100.0)
Platelets: 167 10*3/uL (ref 150–400)
RBC: 3.74 MIL/uL — ABNORMAL LOW (ref 3.87–5.11)
RDW: 12.9 % (ref 11.5–15.5)
WBC: 7.6 10*3/uL (ref 4.0–10.5)
nRBC: 0 % (ref 0.0–0.2)

## 2022-08-31 LAB — BASIC METABOLIC PANEL
Anion gap: 9 (ref 5–15)
BUN: 9 mg/dL (ref 8–23)
CO2: 26 mmol/L (ref 22–32)
Calcium: 8.2 mg/dL — ABNORMAL LOW (ref 8.9–10.3)
Chloride: 95 mmol/L — ABNORMAL LOW (ref 98–111)
Creatinine, Ser: 0.64 mg/dL (ref 0.44–1.00)
GFR, Estimated: >60 mL/min (ref >60)
Glucose, Bld: 178 mg/dL — ABNORMAL HIGH (ref 70–99)
Potassium: 3.8 mmol/L (ref 3.5–5.1)
Sodium: 130 mmol/L — ABNORMAL LOW (ref 135–145)

## 2022-08-31 LAB — MAGNESIUM: Magnesium: 2 mg/dL (ref 1.7–2.4)

## 2022-08-31 LAB — GLUCOSE, CAPILLARY
Glucose-Capillary: 131 mg/dL — ABNORMAL HIGH (ref 70–99)
Glucose-Capillary: 194 mg/dL — ABNORMAL HIGH (ref 70–99)

## 2022-08-31 LAB — PHOSPHORUS: Phosphorus: 3.7 mg/dL (ref 2.5–4.6)

## 2022-08-31 MED ORDER — PANTOPRAZOLE SODIUM 40 MG PO TBEC: 40.0000 mg | DELAYED_RELEASE_TABLET | Freq: Every day | ORAL | Status: DC | PRN

## 2022-08-31 MED ORDER — LISINOPRIL 10 MG PO TABS: 10.0000 mg | ORAL_TABLET | Freq: Every day | ORAL | 0 refills | Status: AC

## 2022-08-31 MED ORDER — DOCUSATE SODIUM 100 MG PO CAPS: 100.0000 mg | ORAL_CAPSULE | Freq: Two times a day (BID) | ORAL | 0 refills | Status: AC

## 2022-08-31 MED ORDER — FLUTICASONE PROPIONATE 50 MCG/ACT NA SUSP: 2.0000 | Freq: Every day | NASAL | Status: DC | PRN

## 2022-08-31 MED ORDER — GLIPIZIDE ER 5 MG PO TB24: 5.0000 mg | ORAL_TABLET | Freq: Every day | ORAL | Status: DC

## 2022-08-31 MED ORDER — POLYETHYLENE GLYCOL 3350 17 G PO PACK: 17.0000 g | PACK | Freq: Every day | ORAL | 0 refills | Status: AC

## 2022-08-31 MED ORDER — OMEPRAZOLE MAGNESIUM 20 MG PO TBEC: 20.0000 mg | DELAYED_RELEASE_TABLET | Freq: Every day | ORAL | Status: DC | PRN

## 2022-08-31 MED ORDER — SODIUM CHLORIDE 0.9 % IV SOLN: INTRAVENOUS | Status: DC

## 2022-08-31 MED ORDER — LISINOPRIL 10 MG PO TABS
10.0000 mg | ORAL_TABLET | Freq: Every day | ORAL | Status: DC
  Administered 2022-08-31: 10 mg via ORAL
  Filled 2022-08-31: qty 1

## 2022-08-31 MED ORDER — POTASSIUM CHLORIDE CRYS ER 20 MEQ PO TBCR
40.0000 meq | EXTENDED_RELEASE_TABLET | Freq: Once | ORAL | Status: AC
  Administered 2022-08-31: 40 meq via ORAL
  Filled 2022-08-31: qty 2

## 2022-08-31 MED ORDER — OXYCODONE HCL 5 MG PO TABS: 5.0000 mg | ORAL_TABLET | Freq: Four times a day (QID) | ORAL | 0 refills | Status: AC | PRN

## 2022-08-31 MED ORDER — EZETIMIBE 10 MG PO TABS
10.0000 mg | ORAL_TABLET | Freq: Every day | ORAL | Status: DC
  Administered 2022-08-31: 10 mg via ORAL
  Filled 2022-08-31: qty 1

## 2022-08-31 MED ORDER — ACETAMINOPHEN 325 MG PO TABS: 650.0000 mg | ORAL_TABLET | Freq: Four times a day (QID) | ORAL | 0 refills | Status: AC | PRN

## 2022-08-31 MED ORDER — DONEPEZIL HCL 10 MG PO TABS: 5.0000 mg | ORAL_TABLET | Freq: Every day | ORAL | Status: DC

## 2022-08-31 NOTE — Discharge Summary (Signed)
Physician Discharge Summary  Lauren Frederick [id number] DOB: 1941-04-16 DOA: 08/27/2022  PCP: Derinda Late, MD  Admit date: 08/27/2022 Discharge date: 08/31/2022  Time spent: 45 minutes  Recommendations for Outpatient Follow-up:   Closed nondisplaced intertrochanteric fracture of right femur [facility]),  mechanical fall -3/10 s/p RIGHT IM nail intertrochanteric surgery  -Uncontrolled pain when active. - Oxycodone IR 5 mg prior to PT.  Oxy IR 5 mg 1 to 2 tablets post PT, then every 6 hours as needed - MiraLAX daily - Colace 100 mg BID - Magnesium citrate as needed if MiraLAX and Colace fail to resolve constipation. -Follow-up with Dr. Rodell Perna orthopedic surgery in 2 weeks  Follow-up, initial encounter - Per daughter patient fell after car door hit her as she was exiting a vehicle resulting in above.  Leukocytosis - Resolved   Prolonged QT interval - QTc 645 - 3/13 prior to discharge EKG QT interval 346.  Resolved - Continue to hold Amitriptyline, would restart Aricept  Essential HTN - Amlodipine 5 mg daily - Lisinopril 10 mg daily (at home on 40 mg daily).  Will allow SNF to adjust if required.  DM type II controlled without complication.  -Glipizide 5 mg daily -3/10 hemoglobin A1c= 7.1 CBG (last 3)  Recent Labs    08/30/22 2110 08/31/22 0735 08/31/22 1106  GLUCAP 163* 131* 194*     Hyperlipidemia -Zetia 10 mg daily  Hyponatremia - Normal saline 123m/hr -Will need to monitor at discharge.   Hypokalemia - Potassium goal>4 -3/13 K-Dur 40 mEq ensure patient receives prior to discharge   History of dementia -Aricept 5 mg QHS, QT prolongation resolved.    Discharge Diagnoses:  Principal Problem:   Intertrochanteric fracture of right hip, closed, initial encounter ([facility] Active Problems:   Fall at home, initial encounter   Leukocytosis   Prolonged QT interval   Essential hypertension   DM[place]   Discharge Condition:  Stable  Diet recommendation: Carb modified  Filed Weights   08/28/22 0326 08/29/22 0500 08/30/22 0651  Weight: 74.7 kg 75.8 kg 76.3 kg    History of present illness:  82year old BF PMHx diabetes type 2, HTN, dementia    Presented with a ground-level fall in a parking lot due to the car door knocking the patient off balance.   Patient had severe right hip pain and unable to bear weight.  No dizziness, lightheadedness, LOC or syncopal episode. EMS was called, x-ray showed right nondisplaced intertrochanteric hip fracture      Hospital Course:  See above  Procedures: 3/10 s/p RIGHT IM nail intertrochanteric surgery  Consultations: Orthopedic surgery Dr. MRodell Perna   Discharge Exam: Vitals:   08/30/22 1336 08/30/22 2108 08/31/22 0518 08/31/22 0916  BP: 137/67 (!) 165/84 (!) 154/75 (!) 157/81  Pulse: 96 99 90   Resp: '14 16 15   '$ Temp: 98 F (36.7 C) 98.4 F (36.9 C) 97.6 F (36.4 C)   TempSrc:  Oral Oral   SpO2: 98% 97% 98%   Weight:      Height:       Physical Exam:  General: No acute respiratory distress Lungs: Clear to auscultation bilaterally without wheezes or crackles Cardiovascular: Regular rate and rhythm without murmur gallop or rub normal S1 and S2 Extremities: RIGHT hip surgical incisions with dressing covered in clean.  No sign of infection.    Discharge Instructions   Allergies as of 08/31/2022       Reactions   Aspirin Hives,  Swelling   Metformin Other (See Comments)   GI upset   Pork-derived Products Nausea And Vomiting   Pravastatin Other (See Comments)   Rosuvastatin Other (See Comments)   leg cramps   Simvastatin Other (See Comments)   GI upset        Medication List     STOP taking these medications    amitriptyline 50 MG tablet Commonly known as: ELAVIL   fexofenadine 180 MG tablet Commonly known as: ALLEGRA   ondansetron 8 MG tablet Commonly known as: Zofran       TAKE these medications    acetaminophen 325 MG  tablet Commonly known as: TYLENOL Take 2 tablets (650 mg total) by mouth every 6 (six) hours as needed for mild pain (or Fever >/= 101).   amLODipine 5 MG tablet Commonly known as: NORVASC Take 5 mg by mouth daily.   docusate sodium 100 MG capsule Commonly known as: COLACE Take 1 capsule (100 mg total) by mouth 2 (two) times daily.   donepezil 5 MG tablet Commonly known as: ARICEPT Take 5 mg by mouth at bedtime.   ezetimibe 10 MG tablet Commonly known as: ZETIA Take 10 mg by mouth daily.   fluticasone 50 MCG/ACT nasal spray Commonly known as: FLONASE Place 2 sprays into both nostrils daily as needed for allergies.   glipiZIDE 5 MG 24 hr tablet Commonly known as: GLUCOTROL XL Take 5 mg by mouth daily with breakfast.   lisinopril 10 MG tablet Commonly known as: ZESTRIL Take 1 tablet (10 mg total) by mouth daily. What changed:  medication strength how much to take   oxyCODONE 5 MG immediate release tablet Commonly known as: Oxy IR/ROXICODONE Take 1 tablet (5 mg total) by mouth every 6 (six) hours as needed for moderate pain or breakthrough pain (Oxycodone IR 5 mg prior to PT.  Oxy IR 5 mg 1 to 2 tablets post PT, then every 6 hours as needed).   polyethylene glycol 17 g packet Commonly known as: MIRALAX / GLYCOLAX Take 17 g by mouth daily. Start taking on: September 01, 2022   potassium chloride 10 MEQ tablet Commonly known as: KLOR-CON Take 1 tablet (10 mEq total) by mouth daily.   PriLOSEC OTC 20 MG tablet Generic drug: omeprazole Take 20 mg by mouth daily as needed (reflux).       Allergies  Allergen Reactions   Aspirin Hives and Swelling   Metformin Other (See Comments)    GI upset   Pork-Derived Products Nausea And Vomiting   Pravastatin Other (See Comments)   Rosuvastatin Other (See Comments)    leg cramps   Simvastatin Other (See Comments)    GI upset    Contact information for follow-up providers     Marybelle Killings, MD Follow up in 2 day(s).    Specialty: Orthopedic Surgery Why: 2 wks from surgery Contact information: [place] [address] [phone]              Contact information for after-discharge care     Destination     [facility] .   Service: Skilled Nursing Contact information: [address] [place] [phone]                      The results of significant diagnostics from this hospitalization (including imaging, microbiology, ancillary and laboratory) are listed below for reference.    Significant Diagnostic Studies: Pelvis Portable  Result Date: 08/28/2022  CLINICAL DATA:  Postop RIGHT hip surgery today. EXAM: PORTABLE PELVIS 1-2 VIEWS COMPARISON:  08/27/2022, 08/28/2022 FINDINGS: Interval ORIF of RIGHT intertrochanteric fracture with lag screw and intramedullary nail. The hip appears normally located on this frontal view. Post surgical clips and soft tissue gas identified LATERAL to the RIGHT hip. No new fractures identified. IMPRESSION: Interval ORIF of RIGHT intertrochanteric fracture. Electronically Signed   By: Nolon Nations M.D.   On: 08/28/2022 12:19   DG HIP UNILAT WITH PELVIS 2-3 VIEWS RIGHT  Result Date: 08/28/2022 CLINICAL DATA:  Elective surgery. EXAM: DG HIP (WITH OR WITHOUT PELVIS) 2-3V RIGHT COMPARISON:  Right hip radiographs 08/27/2022 FLUOROSCOPY: Radiation Exposure Index (as provided by the fluoroscopic device): 7.12 mGy Kerma FINDINGS: Three intraoperative spot fluoroscopic images are provided. A right femoral intramedullary nail and proximal and distal interlocking screws have been placed for treatment of the previously shown intertrochanteric fracture. Alignment is grossly anatomic. IMPRESSION: Intraoperative images during right femoral ORIF. Electronically Signed   By: Logan Bores M.D.   On: 08/28/2022 11:56   DG C-Arm 1-60 Min-No Report  Result Date: 08/28/2022 Fluoroscopy was utilized by the requesting  physician.  No radiographic interpretation.   DG Chest 1 View  Result Date: 08/28/2022 CLINICAL DATA:  Hip fracture.  Preop EXAM: CHEST  1 VIEW COMPARISON:  Report from radiograph Nov 13, 1996 FINDINGS: Normal cardiomediastinal silhouette. No focal consolidation, pleural effusion, or pneumothorax. No acute osseous abnormality. IMPRESSION: No active disease. Electronically Signed   By: Placido Sou M.D.   On: 08/28/2022 00:10   DG Hip Unilat  With Pelvis 2-3 Views Right  Result Date: 08/27/2022 CLINICAL DATA:  Fall and right hip pain. EXAM: DG HIP (WITH OR WITHOUT PELVIS) 2-3V RIGHT COMPARISON:  CT of the abdomen pelvis 02/08/2016. FINDINGS: Evaluation is limited due to osteopenia and body habitus. There is a nondisplaced intertrochanteric fracture of the right femur. No other acute fracture. The bones are osteopenic. Mild bilateral hip arthritic changes. The soft tissues are unremarkable. IMPRESSION: Nondisplaced intertrochanteric fracture of the right femur. Electronically Signed   By: Anner Crete M.D.   On: 08/27/2022 22:24    Microbiology: Recent Results (from the past 240 hour(s))  Surgical pcr screen     Status: None   Collection Time: 08/28/22  3:05 AM   Specimen: Nasal Mucosa; Nasal Swab  Result Value Ref Range Status   MRSA, PCR NEGATIVE NEGATIVE Final   Staphylococcus aureus NEGATIVE NEGATIVE Final    Comment: (NOTE) The Xpert SA Assay (FDA approved for NASAL specimens in patients 26 years of age and older), is one component of a comprehensive surveillance program. It is not intended to diagnose infection nor to guide or monitor treatment. Performed at [facility], [place] [address], [place], [place] [address]      Labs: Basic Metabolic Panel: Recent Labs  Lab 08/27/22 2316 08/28/22 0133 08/29/22 0322 08/30/22 0336 08/31/22 0349  NA 133* 133* 131* 131* 130*  K 3.5 3.4* 3.7 3.7 3.8  CL 98 97* 99 96* 95*  CO2 '23 26 24 26 26  '$ GLUCOSE 196* 209*  247* 169* 178*  BUN '11 11 11 '$ 7* 9  CREATININE 0.82 0.78 0.71 0.51 0.64  CALCIUM 8.6* 8.6* 8.2* 8.2* 8.2*  MG  --  2.4  --   --  2.0  PHOS  --   --   --   --  3.7   Liver Function Tests: Recent Labs  Lab 08/27/22 2316 08/28/22 0133  AST  31 28  ALT 22 23  ALKPHOS 65 69  BILITOT 0.6 0.4  PROT 7.0 7.1  ALBUMIN 3.8 3.9   No results for input(s): "LIPASE", "AMYLASE" in the last 168 hours. No results for input(s): "AMMONIA" in the last 168 hours. CBC: Recent Labs  Lab 08/27/22 2316 08/28/22 0412 08/29/22 0322 08/30/22 0336 08/31/22 0349  WBC 13.6* 11.4* 10.6* 9.3 7.6  NEUTROABS 11.8* 9.0*  --   --   --   HGB 13.3 12.4 11.4* 11.1* 10.9*  HCT 41.2 38.2 36.0 34.3* 33.8*  MCV 90.5 91.4 93.3 90.7 90.4  PLT 232 214 170 170 167   Cardiac Enzymes: No results for input(s): "CKTOTAL", "CKMB", "CKMBINDEX", "TROPONINI" in the last 168 hours. BNP: BNP (last 3 results) No results for input(s): "BNP" in the last 8760 hours.  ProBNP (last 3 results) No results for input(s): "PROBNP" in the last 8760 hours.  CBG: Recent Labs  Lab 08/30/22 1148 08/30/22 1623 08/30/22 2110 08/31/22 0735 08/31/22 1106  GLUCAP 195* 150* 163* 131* 194*       Signed:  Dia Crawford, MD [facility]

## 2022-08-31 NOTE — Plan of Care (Signed)
  Problem: Coping: Goal: Level of anxiety will decrease Outcome: Progressing   Problem: Pain Managment: Goal: General experience of comfort will improve Outcome: Progressing   Problem: Safety: Goal: Ability to remain free from injury will improve Outcome: Progressing   

## 2022-08-31 NOTE — TOC Transition Note (Signed)
Transition of Care [facility]) - CM/SW Discharge Note   Patient Details  Name: Lauren Frederick MRN: [id number] Date of Birth: 1940/09/01  [facility]) CM/SW Contact:  Lennart Pall, LCSW Phone Number: 08/31/2022, 2:06 PM   Clinical Narrative:     Pt medically cleared for dc today to [facility] and insurance authorization received.  Pt and family aware and agreeable.  PTAR called at 1405.  RN to call report to [phone].  No further TOC needs.  Final next level of care: Skilled Nursing Facility Barriers to Discharge: Barriers Resolved   Patient Goals and CMS Choice      Discharge Placement PASRR number recieved: 08/30/22 PASRR number recieved: 08/30/22            Patient chooses bed at: [facility] Patient to be transferred to facility by: [place] Name of family member notified: daughter Patient and family notified of of transfer: 08/31/22  Discharge Plan and Services Additional resources added to the After Visit Summary for   In-house Referral: Clinical Social Work   Post Acute Care Choice: [place]          DME Arranged: N/A         HH Arranged: NA          Social Determinants of Health (SDOH) Interventions SDOH Screenings   Tobacco Use: Low Risk  (08/31/2022)     Readmission Risk Interventions    08/29/2022    2:21 PM  Readmission Risk Prevention Plan  Post Dischage Appt Complete  Medication Screening Complete  Transportation Screening Complete

## 2022-08-31 NOTE — Progress Notes (Signed)
Patient ID: Lauren Frederick, female   DOB: [birth year], 82 y.o.   MRN: [id number]   Subjective: 3 Days Post-Op Procedure(s) (LRB): INTRAMEDULLARY (IM) NAIL INTERTROCHANTERIC (Right) Patient reports pain as moderate.  "Hurts when I move"  Objective: Vital signs in last 24 hours: Temp:  [97.6 F (36.4 C)-98.4 F (36.9 C)] 97.6 F (36.4 C) (03/13 0518) Pulse Rate:  [90-99] 90 (03/13 0518) Resp:  [14-16] 15 (03/13 0518) BP: (137-165)/(67-84) 154/75 (03/13 0518) SpO2:  [97 %-98 %] 98 % (03/13 0518)  Intake/Output from previous day: 03/12 0701 - 03/13 0700 In: 480 [P.O.:480] Out: 2500 [Urine:2500] Intake/Output this shift: No intake/output data recorded.  Recent Labs    08/29/22 0322 08/30/22 0336 08/31/22 0349  HGB 11.4* 11.1* 10.9*   Recent Labs    08/30/22 0336 08/31/22 0349  WBC 9.3 7.6  RBC 3.78* 3.74*  HCT 34.3* 33.8*  PLT 170 167   Recent Labs    08/30/22 0336 08/31/22 0349  NA 131* 130*  K 3.7 3.8  CL 96* 95*  CO2 26 26  BUN 7* 9  CREATININE 0.51 0.64  GLUCOSE 169* 178*  CALCIUM 8.2* 8.2*   No results for input(s): "LABPT", "INR" in the last 72 hours.  PE:  dressing intact.  No results found.  Assessment/Plan: 3 Days Post-Op Procedure(s) (LRB): INTRAMEDULLARY (IM) NAIL INTERTROCHANTERIC (Right) Plan:  PT , SNF .  Marybelle Killings 08/31/2022, 7:23 AM

## 2022-08-31 NOTE — Plan of Care (Signed)
  Problem: Health Behavior/Discharge Planning: Goal: Ability to manage health-related needs will improve Outcome: Adequate for Discharge   Problem: Clinical Measurements: Goal: Ability to maintain clinical measurements within normal limits will improve Outcome: Adequate for Discharge Goal: Will remain free from infection Outcome: Adequate for Discharge Goal: Diagnostic test results will improve Outcome: Adequate for Discharge Goal: Respiratory complications will improve Outcome: Adequate for Discharge   Problem: Activity: Goal: Risk for activity intolerance will decrease Outcome: Adequate for Discharge   Problem: Coping: Goal: Level of anxiety will decrease Outcome: Adequate for Discharge   Problem: Elimination: Goal: Will not experience complications related to bowel motility Outcome: Adequate for Discharge   Problem: Pain Managment: Goal: General experience of comfort will improve Outcome: Adequate for Discharge   Problem: Safety: Goal: Ability to remain free from injury will improve Outcome: Adequate for Discharge   Problem: Skin Integrity: Goal: Risk for impaired skin integrity will decrease Outcome: Adequate for Discharge   Problem: Education: Goal: Ability to describe self-care measures that may prevent or decrease complications (Diabetes Survival Skills Education) will improve Outcome: Adequate for Discharge   Problem: Coping: Goal: Ability to adjust to condition or change in health will improve Outcome: Adequate for Discharge   Problem: Fluid Volume: Goal: Ability to maintain a balanced intake and output will improve Outcome: Adequate for Discharge   Problem: Health Behavior/Discharge Planning: Goal: Ability to identify and utilize available resources and services will improve Outcome: Adequate for Discharge Goal: Ability to manage health-related needs will improve Outcome: Adequate for Discharge   Problem: Metabolic: Goal: Ability to maintain  appropriate glucose levels will improve Outcome: Adequate for Discharge   Problem: Nutritional: Goal: Maintenance of adequate nutrition will improve Outcome: Adequate for Discharge Goal: Progress toward achieving an optimal weight will improve Outcome: Adequate for Discharge   Problem: Skin Integrity: Goal: Risk for impaired skin integrity will decrease Outcome: Adequate for Discharge   Problem: Tissue Perfusion: Goal: Adequacy of tissue perfusion will improve Outcome: Adequate for Discharge   Problem: Education: Goal: Verbalization of understanding the information provided (i.e., activity precautions, restrictions, etc) will improve Outcome: Adequate for Discharge   Problem: Activity: Goal: Ability to ambulate and perform ADLs will improve Outcome: Adequate for Discharge   Problem: Clinical Measurements: Goal: Postoperative complications will be avoided or minimized Outcome: Adequate for Discharge   Problem: Self-Concept: Goal: Ability to maintain and perform role responsibilities to the fullest extent possible will improve Outcome: Adequate for Discharge   Problem: Pain Management: Goal: Pain level will decrease Outcome: Adequate for Discharge

## 2022-08-31 NOTE — Care Management Important Message (Signed)
Important Message  Patient Details  Name: Lauren Frederick MRN: [id number] Date of Birth: 17-Jun-1941   Medicare Important Message Given:  Yes     Memory Argue 08/31/2022, 3:41 PM

## 2022-08-31 NOTE — Plan of Care (Signed)
  Problem: Activity: Goal: Risk for activity intolerance will decrease Outcome: Progressing   Problem: Pain Managment: Goal: General experience of comfort will improve Outcome: Progressing   Problem: Education: Goal: Ability to describe self-care measures that may prevent or decrease complications (Diabetes Survival Skills Education) will improve Outcome: Progressing

## 2022-08-31 NOTE — Progress Notes (Incomplete)
PROGRESS NOTE    Lauren Frederick  [id number] DOB: 1941/04/29 DOA: 08/27/2022 PCP: Derinda Late, MD     Brief Narrative:  82 year old BF PMHx diabetes type 2, HTN, dementia   Presented with a ground-level fall in a parking lot due to the car door knocking the patient off balance.   Patient had severe right hip pain and unable to bear weight.  No dizziness, lightheadedness, LOC or syncopal episode. EMS was called, x-ray showed right nondisplaced intertrochanteric hip fracture    Subjective: ***   Assessment & Plan: Covid vaccination;   Principal Problem:   Intertrochanteric fracture of right hip, closed, initial encounter [facility]) Active Problems:   Fall at home, initial encounter   Leukocytosis   Prolonged QT interval   Essential hypertension   DM2 (diabetes mellitus, type 2) ([place])   Closed nondisplaced intertrochanteric fracture of right femur ([place]),  mechanical fall -s/p right IM nail intertrochanteric surgery on 3/10, post op day # 2 -Pain controlled -H&H stable -No BM yet, placed on daily MiraLAX, Colace     Fall , initial encounter -Per patient lives at home with her daughter -PT evaluation completed, recommended SNF for rehab      Leukocytosis -Likely reactive, resolved -UA negative for UTI.  Chest x-ray negative for any pneumonia     Prolonged QT interval -QTc very prolonged 645 -Hold amitriptyline, Aricept, keep K >4, mg ~2 -Avoid QT prolonging meds -Repeat EKG     Essential hypertension -BP currently stable, resume amlodipine, lisinopril   DM type II controlled without complication. ***** -Hold glipizide, -3/10 hemoglobin A1c= 7.1 CBG (last 3)  Recent Labs    08/30/22 1623 08/30/22 2110 08/31/22 0735  GLUCAP 150* 163* 131*     Hyperlipidemia -Continue Zetia -3/13 lipid panel pending  Hyponatremia****** - Normal saline 175m/hr  Hypokalemia***** - Potassium goal>4 -3/13 K-Dur 40 mEq   History of dementia -Hold Aricept due to QT  prolongation    Mobility Assessment (last 72 hours)     Mobility Assessment     Row Name 08/31/22 0000 08/30/22 0807 08/29/22 1945 08/29/22 1412 08/28/22 2000   Does patient have an order for bedrest or is patient medically unstable No - Continue assessment No - Continue assessment No - Continue assessment -- No - Continue assessment   What is the highest level of mobility based on the progressive mobility assessment? Level 3 (Stands with assist) - Balance while standing  and cannot march in place -- Level 3 (Stands with assist) - Balance while standing  and cannot march in place Level 1 (Bedfast) - Unable to balance while sitting on edge of bed Level 2 (Chairfast) - Balance while sitting on edge of bed and cannot stand   Is the above level different from baseline mobility prior to current illness? Yes - Recommend PT order -- Yes - Recommend PT order -- Yes - Recommend PT order                  DVT prophylaxis: *** Code Status: *** Family Communication: *** Status is: Inpatient  {Inpatient:23812}  Dispo: The patient is from: {From:23814}              Anticipated d/c is to: {To:23815}              Anticipated d/c date is: {Days:23816}              Patient currently {Medically stable:23817}      Consultants:  ***  Procedures/Significant Events:  ***  I have personally reviewed and interpreted all radiology studies and my findings are as above.  VENTILATOR SETTINGS: ***   Cultures ***  Antimicrobials: ***   Devices ***   LINES / TUBES:  ***    Continuous Infusions:   Objective: Vitals:   08/30/22 0651 08/30/22 1336 08/30/22 2108 08/31/22 0518  BP: (!) 156/71 137/67 (!) 165/84 (!) 154/75  Pulse: 98 96 99 90  Resp: '17 14 16 15  '$ Temp: 99.6 F (37.6 C) 98 F (36.7 C) 98.4 F (36.9 C) 97.6 F (36.4 C)  TempSrc: Oral  Oral Oral  SpO2: 93% 98% 97% 98%  Weight: 76.3 kg     Height:        Intake/Output Summary (Last 24 hours) at 08/31/2022  0820 Last data filed at 08/31/2022 0200 Gross per 24 hour  Intake 360 ml  Output 2300 ml  Net -1940 ml   Filed Weights   08/28/22 0326 08/29/22 0500 08/30/22 0651  Weight: 74.7 kg 75.8 kg 76.3 kg    Examination:  General: No acute respiratory distress Eyes: negative scleral hemorrhage, negative anisocoria, negative icterus*** ENT: Negative Runny nose, negative gingival bleeding,*** Neck:  Negative scars, masses, torticollis, lymphadenopathy, JVD*** Lungs: Clear to auscultation bilaterally without wheezes or crackles Cardiovascular: Regular rate and rhythm without murmur gallop or rub normal S1 and S2 Abdomen: negative abdominal pain, nondistended, positive soft, bowel sounds, no rebound, no ascites, no appreciable mass Extremities: No significant cyanosis, clubbing, or edema bilateral lower extremities Skin: Negative rashes, lesions, ulcers*** Psychiatric:  Negative depression, negative anxiety, negative fatigue, negative mania *** Central nervous system:  Cranial nerves II through XII intact, tongue/uvula midline, all extremities muscle strength 5/5, sensation intact throughout, finger nose finger bilateral within normal limits, quick finger touch bilateral within normal limits, negative Romberg sign, heel to shin bilateral within normal limits, standing on 1 foot bilateral within normal limits, walking on tiptoes within normal limits, walking on heels within normal limits, negative dysarthria, negative expressive aphasia, negative receptive aphasia.***  .     Data Reviewed: Care during the described time interval was provided by me .  I have reviewed this patient's available data, including medical history, events of note, physical examination, and all test results as part of my evaluation.  CBC: Recent Labs  Lab 08/27/22 2316 08/28/22 0412 08/29/22 0322 08/30/22 0336 08/31/22 0349  WBC 13.6* 11.4* 10.6* 9.3 7.6  NEUTROABS 11.8* 9.0*  --   --   --   HGB 13.3 12.4 11.4*  11.1* 10.9*  HCT 41.2 38.2 36.0 34.3* 33.8*  MCV 90.5 91.4 93.3 90.7 90.4  PLT 232 214 170 170 [id number]   Basic Metabolic Panel: Recent Labs  Lab 08/27/22 2316 08/28/22 0133 08/29/22 0322 08/30/22 0336 08/31/22 0349  NA 133* 133* 131* 131* 130*  K 3.5 3.4* 3.7 3.7 3.8  CL 98 97* 99 96* 95*  CO2 '23 26 24 26 26  '$ GLUCOSE 196* 209* 247* 169* 178*  BUN '11 11 11 '$ 7* 9  CREATININE 0.82 0.78 0.71 0.51 0.64  CALCIUM 8.6* 8.6* 8.2* 8.2* 8.2*  MG  --  2.4  --   --   --    GFR: Estimated Creatinine Clearance: 56.3 mL/min (by C-G formula based on SCr of 0.64 mg/dL). Liver Function Tests: Recent Labs  Lab 08/27/22 2316 08/28/22 0133  AST 31 28  ALT 22 23  ALKPHOS 65 69  BILITOT 0.6 0.4  PROT 7.0 7.1  ALBUMIN 3.8 3.9   No results for  input(s): "LIPASE", "AMYLASE" in the last 168 hours. No results for input(s): "AMMONIA" in the last 168 hours. Coagulation Profile: Recent Labs  Lab 08/27/22 2316  INR 1.1   Cardiac Enzymes: No results for input(s): "CKTOTAL", "CKMB", "CKMBINDEX", "TROPONINI" in the last 168 hours. BNP (last 3 results) No results for input(s): "PROBNP" in the last 8760 hours. HbA1C: No results for input(s): "HGBA1C" in the last 72 hours. CBG: Recent Labs  Lab 08/30/22 0723 08/30/22 1148 08/30/22 1623 08/30/22 2110 08/31/22 0735  GLUCAP 152* 195* 150* 163* 131*   Lipid Profile: No results for input(s): "CHOL", "HDL", "LDLCALC", "TRIG", "CHOLHDL", "LDLDIRECT" in the last 72 hours. Thyroid Function Tests: No results for input(s): "TSH", "T4TOTAL", "FREET4", "T3FREE", "THYROIDAB" in the last 72 hours. Anemia Panel: No results for input(s): "VITAMINB12", "FOLATE", "FERRITIN", "TIBC", "IRON", "RETICCTPCT" in the last 72 hours. Sepsis Labs: No results for input(s): "PROCALCITON", "LATICACIDVEN" in the last 168 hours.  Recent Results (from the past 240 hour(s))  Surgical pcr screen     Status: None   Collection Time: 08/28/22  3:05 AM   Specimen: Nasal Mucosa;  Nasal Swab  Result Value Ref Range Status   MRSA, PCR NEGATIVE NEGATIVE Final   Staphylococcus aureus NEGATIVE NEGATIVE Final    Comment: (NOTE) The Xpert SA Assay (FDA approved for NASAL specimens in patients 48 years of age and older), is one component of a comprehensive surveillance program. It is not intended to diagnose infection nor to guide or monitor treatment. Performed at [facility], [place] [address], [place], [place] [address]          Radiology Studies: No results found.      Scheduled Meds:  amLODipine  5 mg Oral Daily   docusate sodium  100 mg Oral BID   enoxaparin (LOVENOX) injection  40 mg Subcutaneous Q24H   insulin aspart  0-15 Units Subcutaneous TID WC   insulin aspart  0-5 Units Subcutaneous QHS   insulin aspart  3 Units Subcutaneous TID WC   polyethylene glycol  17 g Oral Daily   Continuous Infusions:   LOS: 3 days    Time spent:40 min***    Lanyla Costello, Geraldo Docker, MD [facility]   If 7PM-7AM, please contact night-coverage 08/31/2022, 8:20 AM

## 2022-09-20 ENCOUNTER — Encounter: Payer: Self-pay | Admitting: Orthopaedic Surgery

## 2022-09-20 ENCOUNTER — Ambulatory Visit (INDEPENDENT_AMBULATORY_CARE_PROVIDER_SITE_OTHER): Payer: Medicare PPO | Admitting: Orthopaedic Surgery

## 2022-09-20 ENCOUNTER — Other Ambulatory Visit (INDEPENDENT_AMBULATORY_CARE_PROVIDER_SITE_OTHER): Payer: Medicare PPO

## 2022-09-20 VITALS — BP 161/74 | HR 85 | Ht 65.0 in | Wt 168.0 lb

## 2022-09-20 DIAGNOSIS — S72141D Displaced intertrochanteric fracture of right femur, subsequent encounter for closed fracture with routine healing: Secondary | ICD-10-CM

## 2022-09-20 NOTE — Progress Notes (Signed)
   Post-Op Visit Note   Patient: Lauren Frederick           Date of Birth: 10-Apr-1941           MRN: [id number] Visit Date: 09/20/2022 PCP: Derinda Late, MD   Assessment & Plan:  Chief Complaint:  Chief Complaint  Patient presents with   Right Hip - Routine Post Op    08/28/2022 IM Nail Right Intertroch   Visit Diagnoses:  1. Intertrochanteric fracture of right hip, closed, with routine healing, subsequent encounter     Plan: Staples removed Steri-Strips applied x-rays show excellent position and alignment of her nondisplaced fracture.  Her son is with her today and states that the plan is for her to go live with her sister in [place] when she leaves the facility.  If she still is in town she can come back to see me in 5 weeks if not she can follow-up with a local orthopedist in the future in [place] if needed.  Follow-Up Instructions: No follow-ups on file.   Orders:  Orders Placed This Encounter  Procedures   XR HIP UNILAT W OR W/O PELVIS 2-3 VIEWS RIGHT   No orders of the defined types were placed in this encounter.   Imaging: No results found.  PMFS History: Patient Active Problem List   Diagnosis Date Noted   Intertrochanteric fracture of right hip, closed, initial encounter 08/28/2022   Fall at home, initial encounter 08/28/2022   Leukocytosis 08/28/2022   Prolonged QT interval 08/28/2022   Essential hypertension 08/28/2022   DM2 (diabetes mellitus, type 2) 08/28/2022   Hypokalemia 02/08/2016   Past Medical History:  Diagnosis Date   Allergy    Arthritis    Cataract    Dementia    Diabetes mellitus without complication    Hypertension     Family History  Problem Relation Age of Onset   Cancer Mother    Hypertension Father    Breast cancer Neg Hx     Past Surgical History:  Procedure Laterality Date   ABDOMINAL HYSTERECTOMY     EYE SURGERY     INTRAMEDULLARY (IM) NAIL INTERTROCHANTERIC Right 08/28/2022   Procedure: INTRAMEDULLARY (IM) NAIL  INTERTROCHANTERIC;  Surgeon: Marybelle Killings, MD;  Location: [facility];  Service: Orthopedics;  Laterality: Right;  hana table, Biomet affixus nail   THYROID SURGERY     THYROID SURGERY  approx. 30 years ago   Social History   Occupational History   Not on file  Tobacco Use   Smoking status: Never   Smokeless tobacco: Never  Vaping Use   Vaping Use: Never used  Substance and Sexual Activity   Alcohol use: No   Drug use: No   Sexual activity: Not on file

## 2022-11-19 DEATH — deceased

## 2022-12-06 ENCOUNTER — Encounter (HOSPITAL_COMMUNITY): Payer: Self-pay | Admitting: Orthopaedic Surgery

## 2023-07-04 IMAGING — MG MM DIGITAL SCREENING BILAT W/ TOMO AND CAD
6 of 10 series · 6 of 30 positions shown · non-contrast
Comparison: Previous exam(s).

CLINICAL DATA: Screening.

EXAM:
DIGITAL SCREENING BILATERAL MAMMOGRAM WITH TOMOSYNTHESIS AND CAD
TECHNIQUE: Bilateral screening digital craniocaudal and mediolateral oblique
mammograms were obtained. Bilateral screening digital breast
tomosynthesis was performed. The images were evaluated with
computer-aided detection.

[L CC synth-2D]
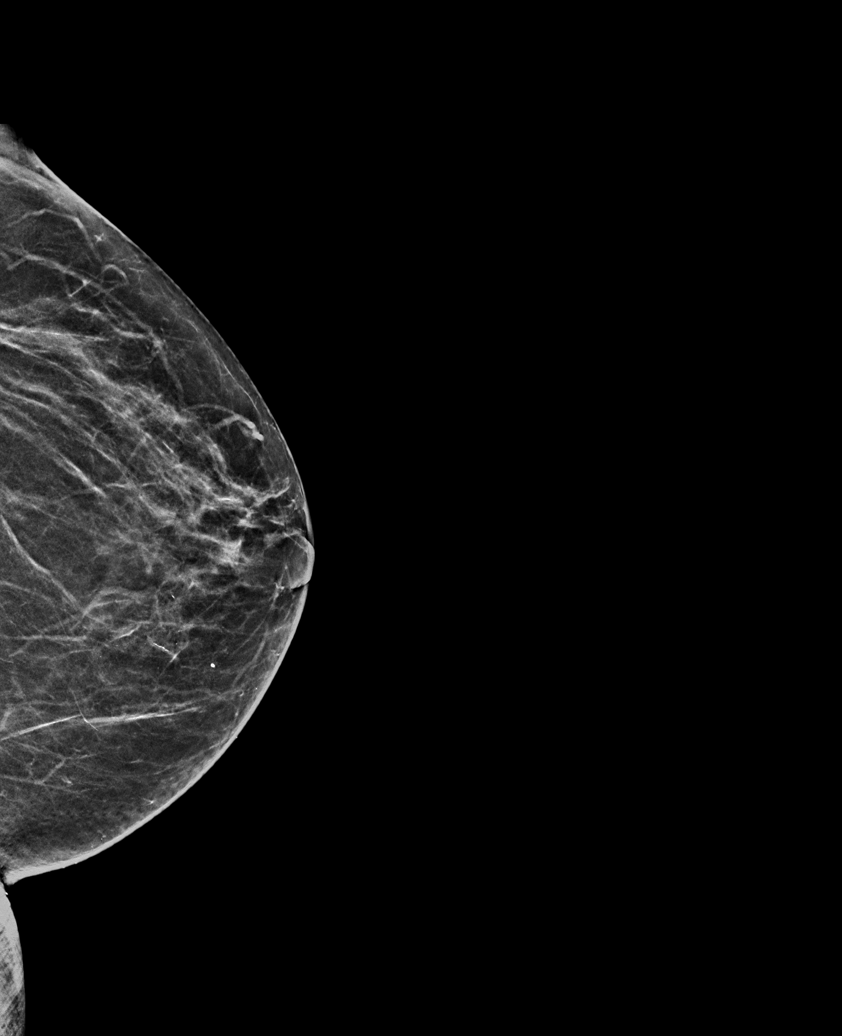

[L MLO synth-2D (1 of 2)]
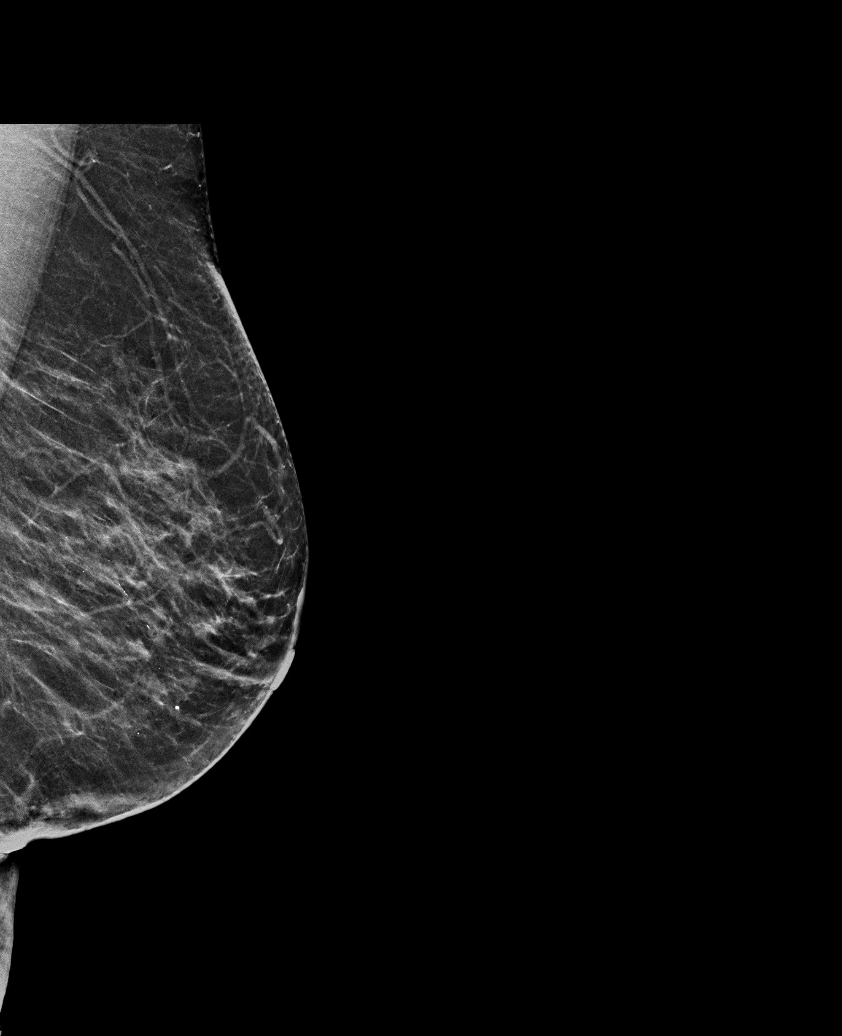

[R MLO synth-2D]
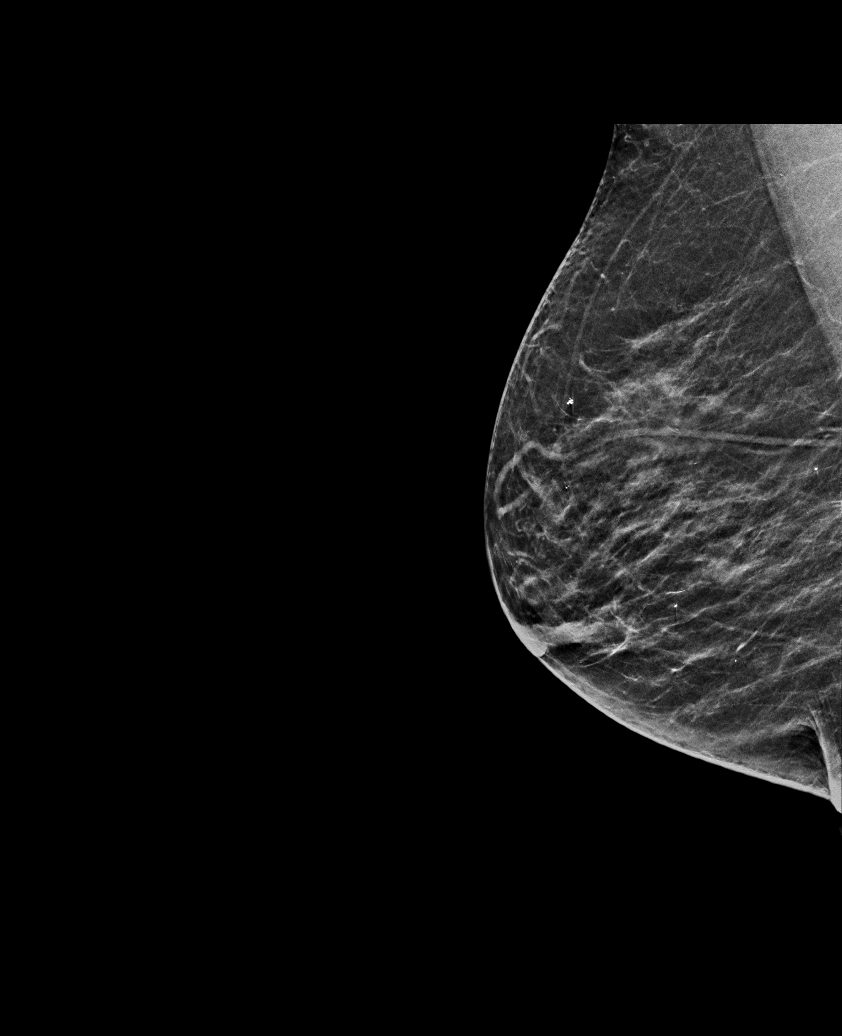

[R CC synth-2D]
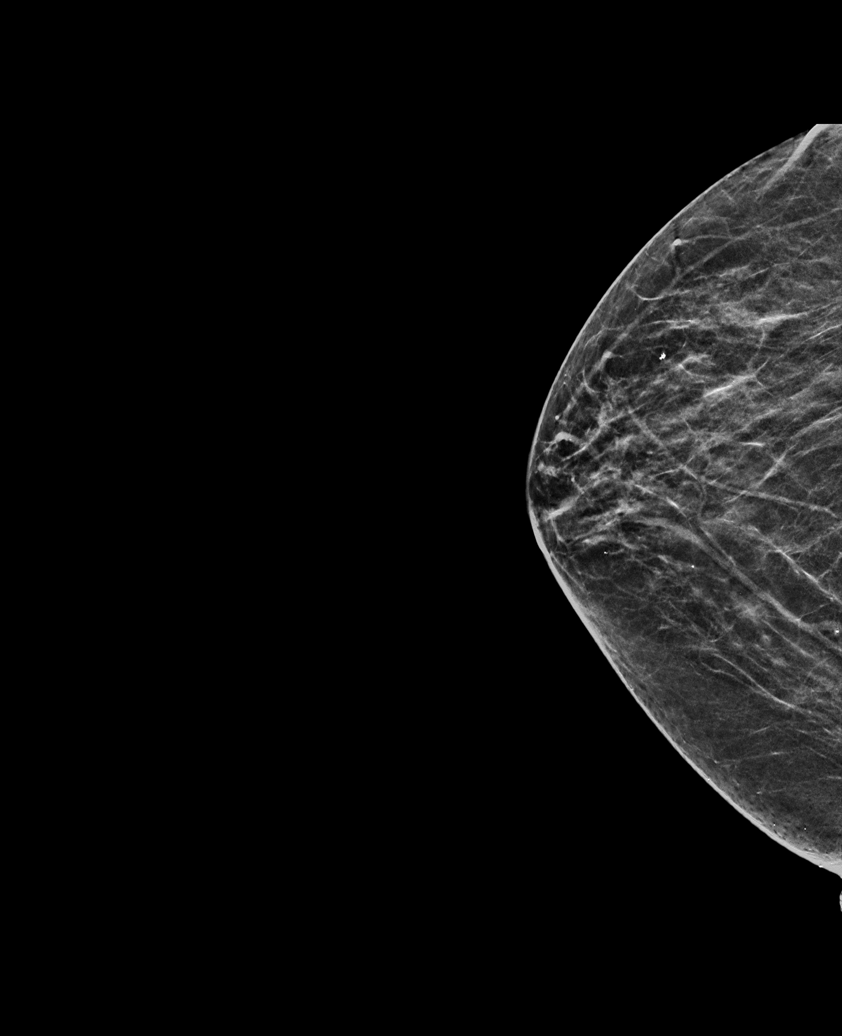

[L MLO synth-2D (2 of 2)]
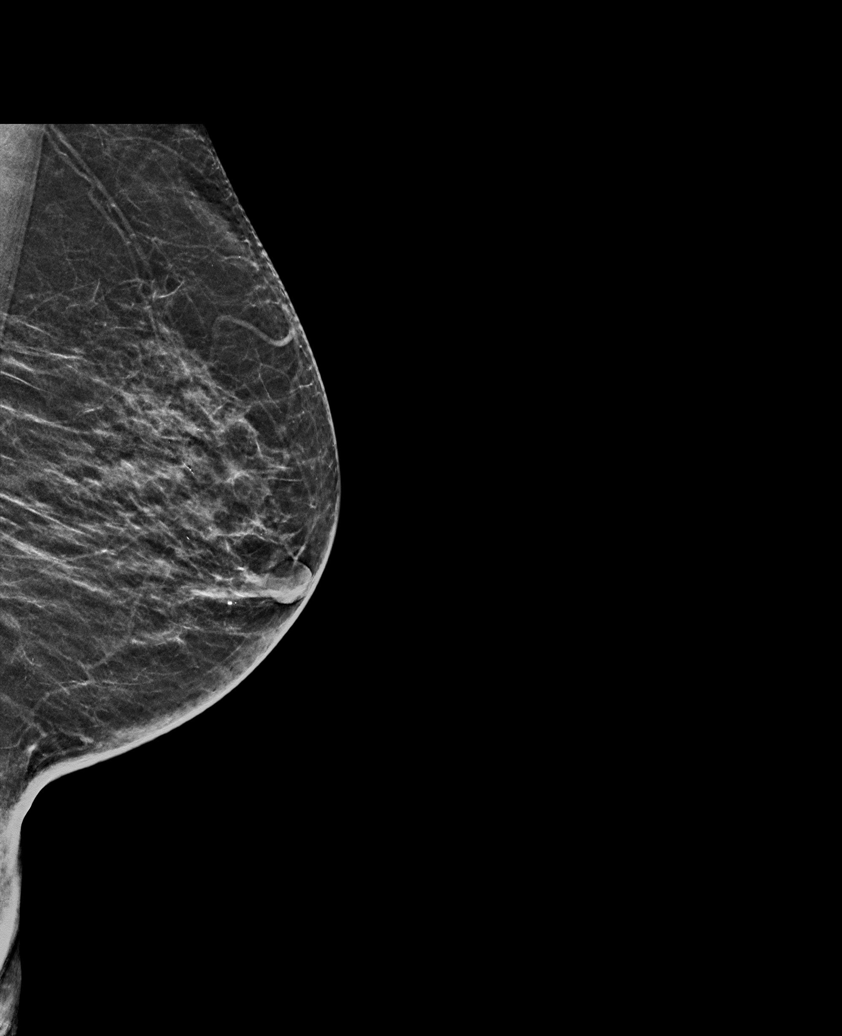

[R MLO tomo · tomo slice 23/44.0]
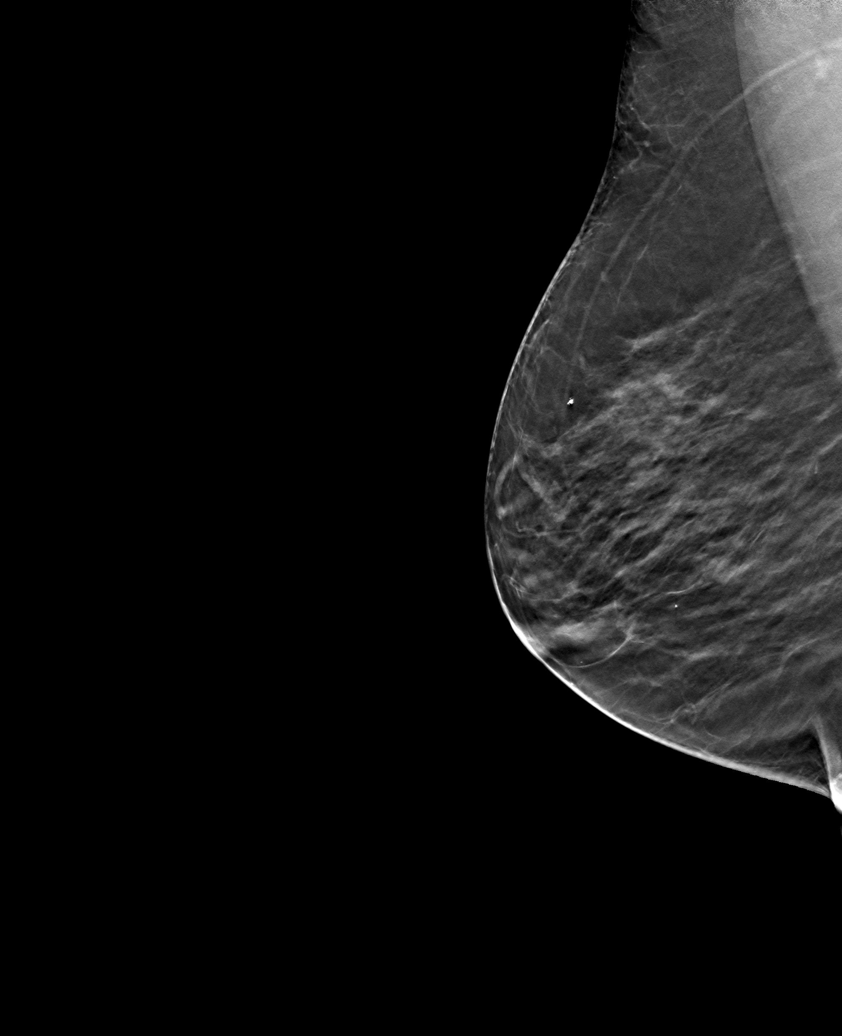

[6 of 30 positions shown; findings below may reference images not displayed]

ACR Breast Density Category b: There are scattered areas of
fibroglandular density.
FINDINGS: There are no findings suspicious for malignancy.
IMPRESSION: No mammographic evidence of malignancy. A result letter of this
screening mammogram will be mailed directly to the patient.

RECOMMENDATION:
Screening mammogram in one year. (Code:51-O-LD2)

BI-RADS CATEGORY  1: Negative.
